# Patient Record
Sex: Male | Born: 1965 | Race: White | Hispanic: No | Marital: Married | Smoking: Former smoker
Health system: Southern US, Community
[De-identification: ages and names within clinical notes are randomized; demographics above are authoritative.]

## PROBLEM LIST (undated history)

## (undated) DIAGNOSIS — M549 Dorsalgia, unspecified: Secondary | ICD-10-CM

## (undated) DIAGNOSIS — M1990 Unspecified osteoarthritis, unspecified site: Secondary | ICD-10-CM

## (undated) DIAGNOSIS — N400 Enlarged prostate without lower urinary tract symptoms: Secondary | ICD-10-CM

## (undated) DIAGNOSIS — F419 Anxiety disorder, unspecified: Secondary | ICD-10-CM

## (undated) DIAGNOSIS — G5600 Carpal tunnel syndrome, unspecified upper limb: Secondary | ICD-10-CM

## (undated) DIAGNOSIS — G8929 Other chronic pain: Secondary | ICD-10-CM

## (undated) DIAGNOSIS — I10 Essential (primary) hypertension: Secondary | ICD-10-CM

---

## 2000-05-16 HISTORY — PX: LUMBAR FUSION: SHX111

## 2000-05-16 HISTORY — PX: BACK SURGERY: SHX140

## 2001-03-27 ENCOUNTER — Encounter: Admission: RE | Admit: 2001-03-27 | Discharge: 2001-03-27 | Payer: Self-pay | Admitting: Orthopaedic Surgery

## 2001-03-27 ENCOUNTER — Encounter: Payer: Self-pay | Admitting: Orthopaedic Surgery

## 2001-04-26 ENCOUNTER — Inpatient Hospital Stay (HOSPITAL_COMMUNITY): Admission: RE | Admit: 2001-04-26 | Discharge: 2001-04-29 | Payer: Self-pay | Admitting: Orthopaedic Surgery

## 2001-04-26 ENCOUNTER — Encounter: Payer: Self-pay | Admitting: Orthopaedic Surgery

## 2002-04-01 ENCOUNTER — Inpatient Hospital Stay (HOSPITAL_COMMUNITY): Admission: EM | Admit: 2002-04-01 | Discharge: 2002-04-02 | Payer: Self-pay | Admitting: Emergency Medicine

## 2002-04-01 ENCOUNTER — Encounter: Payer: Self-pay | Admitting: Emergency Medicine

## 2002-04-02 ENCOUNTER — Encounter: Payer: Self-pay | Admitting: Cardiology

## 2003-10-10 ENCOUNTER — Encounter: Admission: RE | Admit: 2003-10-10 | Discharge: 2003-10-10 | Payer: Self-pay | Admitting: Orthopaedic Surgery

## 2005-04-13 ENCOUNTER — Encounter: Admission: RE | Admit: 2005-04-13 | Discharge: 2005-04-13 | Payer: Self-pay | Admitting: Orthopaedic Surgery

## 2006-05-16 HISTORY — PX: SPINAL CORD STIMULATOR INSERTION: SHX5378

## 2006-09-05 ENCOUNTER — Ambulatory Visit (HOSPITAL_BASED_OUTPATIENT_CLINIC_OR_DEPARTMENT_OTHER): Admission: RE | Admit: 2006-09-05 | Discharge: 2006-09-05 | Payer: Self-pay | Admitting: Orthopedic Surgery

## 2008-02-08 ENCOUNTER — Ambulatory Visit (HOSPITAL_COMMUNITY): Admission: RE | Admit: 2008-02-08 | Discharge: 2008-02-09 | Payer: Self-pay | Admitting: Neurosurgery

## 2009-07-07 ENCOUNTER — Encounter: Payer: Self-pay | Admitting: Pulmonary Disease

## 2009-07-10 ENCOUNTER — Encounter: Payer: Self-pay | Admitting: Pulmonary Disease

## 2009-07-23 ENCOUNTER — Encounter: Payer: Self-pay | Admitting: Pulmonary Disease

## 2009-08-05 ENCOUNTER — Ambulatory Visit: Payer: Self-pay | Admitting: Pulmonary Disease

## 2009-08-05 DIAGNOSIS — R062 Wheezing: Secondary | ICD-10-CM

## 2009-08-05 DIAGNOSIS — R091 Pleurisy: Secondary | ICD-10-CM | POA: Insufficient documentation

## 2009-08-26 ENCOUNTER — Ambulatory Visit: Payer: Self-pay | Admitting: Pulmonary Disease

## 2010-04-05 ENCOUNTER — Ambulatory Visit (HOSPITAL_COMMUNITY)
Admission: RE | Admit: 2010-04-05 | Discharge: 2010-04-05 | Payer: Self-pay | Source: Home / Self Care | Admitting: Neurosurgery

## 2010-04-26 ENCOUNTER — Ambulatory Visit (HOSPITAL_COMMUNITY)
Admission: RE | Admit: 2010-04-26 | Discharge: 2010-04-26 | Payer: Self-pay | Source: Home / Self Care | Attending: Orthopedic Surgery | Admitting: Orthopedic Surgery

## 2010-05-16 HISTORY — PX: CARPAL TUNNEL RELEASE: SHX101

## 2010-05-21 ENCOUNTER — Ambulatory Visit (HOSPITAL_COMMUNITY)
Admission: RE | Admit: 2010-05-21 | Discharge: 2010-05-21 | Payer: Self-pay | Source: Home / Self Care | Attending: Orthopedic Surgery | Admitting: Orthopedic Surgery

## 2010-05-21 LAB — POCT HEMOGLOBIN-HEMACUE: Hemoglobin: 15.8 g/dL (ref 13.0–17.0)

## 2010-05-25 ENCOUNTER — Ambulatory Visit
Admission: RE | Admit: 2010-05-25 | Discharge: 2010-05-25 | Payer: Self-pay | Source: Home / Self Care | Attending: Orthopedic Surgery | Admitting: Orthopedic Surgery

## 2010-05-31 LAB — POCT HEMOGLOBIN-HEMACUE: Hemoglobin: 14.8 g/dL (ref 13.0–17.0)

## 2010-06-15 NOTE — Assessment & Plan Note (Signed)
Summary: bronchitis, wheezing , pleuretic chest pain/not responding to...   Copy to:  [facility]  CC:  Pulmonary consult for chest pain and sob and cough that is prod and non-prod. Some yellowish mucus..  History of Present Illness: 45 yo male for evaluation of wheezing.  He presented to [facility] on Feb 22 with fever, chills, chest congestion, headache, cough with yellow sputum, and body ache.  He tested negative for Influenza, but reports that his son tested positive.  He was noted to have expiratory wheeze on exam which improved after nebulizer treatment.  He was given prednisone, Zpak, and hycodan.  He returned to [facility] on March 5 with similar symptoms.  In addition he had worsening right sided pleuritic chest pain.  He was given additional prednisone and started on ventolin.  He feels that ventolin has helped.  He returned on March 10 to [facility].  He was improving, but had continued chest discomfort.  He still had some wheeze.  He was started on ibuprofen, and pulmonary referral was requested.  He did have an episode of asthmatic bronchitis as a child.  He has not had breathing problems since.  He denies any history of asthma, pneumonia, TB, or allergies.  He denies animal exposure, or occupational exposure.  There is no travel history.  Chest xray report from Feb. 25 showed peribronchial thickening.  Chest xray report from March 10 showed no infiltrate or rib fracture.    Preventive Screening-Counseling & Management  Alcohol-Tobacco     Alcohol drinks/day: 0     Smoking Status: quit     Year Quit: 1995     Pack years: 10 uears x1 ppd  Current Medications (verified): 1)  Ibuprofen 800 Mg Tabs (Ibuprofen) .Marland Kitchen.. 1 By Mouth Three Times A Day 2)  Ventolin Hfa 108 (90 Base) Mcg/act Aers (Albuterol Sulfate) .... 2 Puffs Three Times A Day 3)  Hydrocodone-Acetaminophen 5-500 Mg Tabs (Hydrocodone-Acetaminophen) .Marland Kitchen.. 1-2 By Mouth Every 4-6 Hours As Needed 4)  Alprazolam 1 Mg Tabs  (Alprazolam) .Marland Kitchen.. 1 By Mouth Two Times A Day As Needed  Allergies (verified): 1)  ! Pcn  Past History:  Past Surgical History: 1) Left varicocele 2)  L4-5 right hemilaminectomy with diskectomy and lateral recess decompression, posterior lumbar interbody fusion (PLIF) procedure with autologous left iliac crest bone graft and insertion of a Brantigan cage 3) Left index finger I&D 4) Thoracic laminectomy at T10-T11 with placement of permanent spinal cord stimulator   Family History: Bone cancer---maternal uncle Seasonal allergies---sister  Social History: Married, 3 children.  Works for [employer].  Former smoker 10 pack yr history.  Chews tobacco Smoking Status:  quit Pack years:  10 uears x1 ppd Alcohol drinks/day:  0  Review of Systems       The patient complains of shortness of breath with activity, productive cough, non-productive cough, chest pain, and anxiety.  The patient denies shortness of breath at rest, coughing up blood, irregular heartbeats, acid heartburn, indigestion, loss of appetite, weight change, abdominal pain, difficulty swallowing, sore throat, tooth/dental problems, headaches, nasal congestion/difficulty breathing through nose, sneezing, itching, ear ache, depression, hand/feet swelling, joint stiffness or pain, rash, change in color of mucus, and fever.    Vital Signs:  Patient profile:   45 year old male Height:      72 inches (182.88 cm) Weight:      183.50 pounds (83.41 kg) BMI:     24.98 O2 Sat:      96 % on Room air Temp:  98.7 degrees F (37.06 degrees C) oral Pulse rate:   83 / minute BP sitting:   120 / 74  (right arm) Cuff size:   regular  Vitals Entered By: Michel Bickers CMA (August 05, 2009 3:41 PM)  O2 Sat at Rest %:  96 O2 Flow:  Room air CC: Pulmonary consult for chest pain and sob and cough that is prod and non-prod. Some yellowish mucus.   Physical Exam  General:  normal appearance and healthy appearing.   Eyes:  PERRLA and EOMI.   Ears:   TMs intact and clear with normal canals Nose:  clear nasal discharge.   Mouth:  no deformity or lesions Neck:  no masses, thyromegaly, or abnormal cervical nodes Chest Wall:  tender on palpation over right rib margin Lungs:  clear bilaterally to auscultation and percussion Heart:  regular rate and rhythm, S1, S2 without murmurs, rubs, gallops, or clicks Abdomen:  bowel sounds positive; abdomen soft and non-tender without masses, or organomegaly Msk:  no deformity or scoliosis noted with normal posture Pulses:  pulses normal Extremities:  no clubbing, cyanosis, edema, or deformity noted Neurologic:  CN II-XII grossly intact with normal reflexes, coordination, muscle strength and tone Cervical Nodes:  no significant adenopathy   Pulmonary Function Test Date: 08/05/2009 4:25 PM Gender: Male  Pre-Spirometry FVC    Value: 4.94 L/min   % Pred: 88.90 % FEV1    Value: 4.04 L     Pred: 4.40 L     % Pred: 91.70 % FEV1/FVC  Value: 81.72 %     % Pred: 102.70 %  Impression & Recommendations:  Problem # 1:  WHEEZING (ICD-786.07) He likely had a viral respiratory infection which has triggered asthmatic bronchiitis.  He is slowly improving, but not fully recovered.  Will continue as needed ventolin.  Will give additional course of prednisone.  Will determine if he needs more long term therapy for asthma, or if his symptoms will gradually resolve.  Problem # 2:  PLEURISY (ICD-511.0) He has pleuritic type chest pain in the setting of acute respiratory infection.  His chest xray from Feb 25 and March 10 were relatively unremarkable.  I did not appreciate clinical evidence for pleural effusion on exam today.  I don't think he needs additional imaging studies at this time.  Will have him hold off on scheduled ibuprofen while he is getting additional course of prednisone.  Medications Added to Medication List This Visit: 1)  Ventolin Hfa 108 (90 Base) Mcg/act Aers (Albuterol sulfate) .... 2 puffs three  times a day 2)  Ibuprofen 800 Mg Tabs (Ibuprofen) .Marland Kitchen.. 1 by mouth three times a day 3)  Hydrocodone-acetaminophen 5-500 Mg Tabs (Hydrocodone-acetaminophen) .Marland Kitchen.. 1-2 by mouth every 4-6 hours as needed 4)  Alprazolam 1 Mg Tabs (Alprazolam) .Marland Kitchen.. 1 by mouth two times a day as needed 5)  Prednisone 10 Mg Tabs (Prednisone) .... 3 pills once daily for 2 days, 2 pills once daily for 2 days, 1 pill once daily for 2 days, 1/2 pill once daily for 2 days  Complete Medication List: 1)  Ventolin Hfa 108 (90 Base) Mcg/act Aers (Albuterol sulfate) .... 2 puffs three times a day 2)  Ibuprofen 800 Mg Tabs (Ibuprofen) .Marland Kitchen.. 1 by mouth three times a day 3)  Hydrocodone-acetaminophen 5-500 Mg Tabs (Hydrocodone-acetaminophen) .Marland Kitchen.. 1-2 by mouth every 4-6 hours as needed 4)  Alprazolam 1 Mg Tabs (Alprazolam) .Marland Kitchen.. 1 by mouth two times a day as needed 5)  Prednisone 10 Mg Tabs (  Prednisone) .... 3 pills once daily for 2 days, 2 pills once daily for 2 days, 1 pill once daily for 2 days, 1/2 pill once daily for 2 days  Other Orders: Consultation Level IV (21308) Spirometry w/Graph (94010)  Patient Instructions: 1)  Prednisone 10 mg pills: 3 pills once daily for 2 days, 2 pills once daily for 2 days, 1 pill once daily for 2 days, 1/2 pill once daily for 2 days 2)  Stop ibuprofen while using prednisone 3)  Ventolin two puffs up to four times per day as needed for cough, wheeze, chest congestion 4)  Use nasal irrigation for sinuses once daily  5)  Follow up in 2 weeks Prescriptions: PREDNISONE 10 MG TABS (PREDNISONE) 3 pills once daily for 2 days, 2 pills once daily for 2 days, 1 pill once daily for 2 days, 1/2 pill once daily for 2 days  #13 x 0   Entered and Authorized by:   Coralyn Helling MD   Signed by:   Coralyn Helling MD on 08/05/2009   Method used:   Print then Give to Patient   RxID:   [id number]    CardioPerfect Spirometry  ID: [id number] Patient: Spencer Castro D DOB: 01-29-1966 Age: 45 Years Old Sex:  Male Race: White Physician: Dayle Sherpa Height: 72 Weight: 183.50 Status: Confirmed Recorded: 08/05/2009 4:25 PM  Parameter  Measured Predicted %Predicted FVC     4.94        5.56        88.90 FEV1     4.04        4.40        91.70 FEV1%   81.72        79.60        102.70 PEF    6.31        10.53        59.90   Interpretation: Pre: FVC= 4.94L FEV1= 4.04L FEV1%= 81.7% 4.04/4.94 FEV1/FVC (08/05/2009 4:29:55 PM), Within normal limits.  Poor effort.

## 2010-06-15 NOTE — Letter (Signed)
Summary: Date Range:07-07-09 to 07-23-09/[facility]  Date Range:07-07-09 to 07-23-09/[facility]   Imported By: Sherian Rein 08/14/2009 10:08:07  _____________________________________________________________________  External Attachment:    Type:   Image     Comment:   External Document

## 2010-06-15 NOTE — Assessment & Plan Note (Signed)
Summary: 2 weeks/ mbw   Copy to:  [facility]  CC:  The patient says he is feeling much better after taking Prednisone taper. No wheezing or cough or sob.Marland Kitchen  History of Present Illness: 45 yo male with wheezing and pleurisy.  He feels much better.  He is off prednisone.  He is not taking ibuprofen or tylenol.  He has not used his inhaler in the past one week.  He denies chest pain, cough, fever, sputum, wheeze, or sinus problems.   Current Medications (verified): 1)  Ventolin Hfa 108 (90 Base) Mcg/act Aers (Albuterol Sulfate) .... 2 Puffs Three Times A Day 2)  Ibuprofen 800 Mg Tabs (Ibuprofen) .Marland Kitchen.. 1 By Mouth Three Times A Day 3)  Hydrocodone-Acetaminophen 5-500 Mg Tabs (Hydrocodone-Acetaminophen) .Marland Kitchen.. 1-2 By Mouth Every 4-6 Hours As Needed 4)  Alprazolam 1 Mg Tabs (Alprazolam) .Marland Kitchen.. 1 By Mouth Two Times A Day As Needed  Allergies (verified): 1)  ! Pcn  Vital Signs:  Patient profile:   45 year old male Height:      72 inches (182.88 cm) Weight:      185.38 pounds (84.26 kg) BMI:     25.23 O2 Sat:      94 % on Room air Temp:     98.0 degrees F (36.67 degrees C) oral Pulse rate:   84 / minute BP sitting:   116 / 78  (left arm) Cuff size:   regular  Vitals Entered By: Michel Bickers CMA (August 26, 2009 1:32 PM)  O2 Sat at Rest %:  94 O2 Flow:  Room air  Physical Exam  General:  normal appearance and healthy appearing.   Nose:  no deformity, discharge, inflammation, or lesions Mouth:  no deformity or lesions Neck:  no masses, thyromegaly, or abnormal cervical nodes Chest Wall:  no deformities noted Lungs:  clear bilaterally to auscultation and percussion Heart:  regular rate and rhythm, S1, S2 without murmurs, rubs, gallops, or clicks Extremities:  no clubbing, cyanosis, edema, or deformity noted Cervical Nodes:  no significant adenopathy   Impression & Recommendations:  Problem # 1:  WHEEZING (ICD-786.07)  This has resolved.  Likely related to asthmatic bronchitis from  upper respiratory infection.  He can continue using his inhaler as needed.  Orders: Est. Patient Level II (16109)  Problem # 2:  PLEURISY (ICD-511.0)  Resolved after additional treatment with prednisone.  No further intervention needed at this time.  Orders: Est. Patient Level II (60454)  Complete Medication List: 1)  Ventolin Hfa 108 (90 Base) Mcg/act Aers (Albuterol sulfate) .... 2 puffs three times a day 2)  Ibuprofen 800 Mg Tabs (Ibuprofen) .Marland Kitchen.. 1 by mouth three times a day 3)  Hydrocodone-acetaminophen 5-500 Mg Tabs (Hydrocodone-acetaminophen) .Marland Kitchen.. 1-2 by mouth every 4-6 hours as needed 4)  Alprazolam 1 Mg Tabs (Alprazolam) .Marland Kitchen.. 1 by mouth two times a day as needed  Patient Instructions: 1)  Follow up as needed

## 2010-08-24 ENCOUNTER — Other Ambulatory Visit: Payer: Self-pay | Admitting: Neurosurgery

## 2010-08-24 DIAGNOSIS — M542 Cervicalgia: Secondary | ICD-10-CM

## 2010-08-25 ENCOUNTER — Ambulatory Visit
Admission: RE | Admit: 2010-08-25 | Discharge: 2010-08-25 | Disposition: A | Discharge status: Home / Self Care | Payer: Self-pay | Source: Ambulatory Visit | Attending: Neurosurgery | Admitting: Neurosurgery

## 2010-08-25 ENCOUNTER — Other Ambulatory Visit: Payer: Self-pay | Admitting: Neurosurgery

## 2010-08-25 DIAGNOSIS — M542 Cervicalgia: Secondary | ICD-10-CM

## 2010-09-08 ENCOUNTER — Other Ambulatory Visit: Payer: Self-pay | Admitting: Neurosurgery

## 2010-09-08 DIAGNOSIS — M542 Cervicalgia: Secondary | ICD-10-CM

## 2010-09-17 ENCOUNTER — Ambulatory Visit
Admission: RE | Admit: 2010-09-17 | Discharge: 2010-09-17 | Disposition: A | Discharge status: Home / Self Care | Payer: BC Managed Care – PPO | Source: Ambulatory Visit | Attending: Neurosurgery | Admitting: Neurosurgery

## 2010-09-17 ENCOUNTER — Other Ambulatory Visit: Payer: Self-pay | Admitting: Neurosurgery

## 2010-09-17 DIAGNOSIS — M542 Cervicalgia: Secondary | ICD-10-CM

## 2010-09-20 ENCOUNTER — Ambulatory Visit
Admission: RE | Admit: 2010-09-20 | Discharge: 2010-09-20 | Disposition: A | Discharge status: Home / Self Care | Payer: BC Managed Care – PPO | Source: Ambulatory Visit | Attending: Neurosurgery | Admitting: Neurosurgery

## 2010-09-20 ENCOUNTER — Other Ambulatory Visit: Payer: Self-pay | Admitting: Neurosurgery

## 2010-09-20 DIAGNOSIS — R51 Headache: Secondary | ICD-10-CM

## 2010-09-20 DIAGNOSIS — M542 Cervicalgia: Secondary | ICD-10-CM

## 2010-09-20 DIAGNOSIS — M549 Dorsalgia, unspecified: Secondary | ICD-10-CM

## 2010-09-28 NOTE — Op Note (Signed)
NAMEDARRYL, Spencer Castro                 ACCOUNT NO.:  [id number]   MEDICAL RECORD NO.:  [id number]          PATIENT TYPE:  OIB   LOCATION:  3538                         FACILITY:  [facility]   PHYSICIAN:  Danae Orleans. Venetia Maxon, M.D.  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   PROCEDURES:  Thoracic laminectomy at T10-T11 with placement of permanent  spinal cord stimulator lead and implantable pulse generator using [employer].   SURGEON:  Danae Orleans. Venetia Maxon, MD   ASSISTANT:  Georgiann Cocker, RN   ANESTHESIA:  General endotracheal anesthesia.   ESTIMATED BLOOD LOSS:  Minimal.   COMPLICATIONS:  None.   DISPOSITION:  Recovery.   INDICATIONS:  [name] is a 45 year old man with chronic intractable  back and right greater than left lower extremity pain.  He has had prior  spinal surgery.  It was elected to take him to Surgery for spinal cord  stimulator placement after successful trial of spinal cord stimulation.   PROCEDURE IN DETAIL:  [name] was brought to the operating room.  Following satisfactory and uncomplicated induction of general  endotracheal anesthesia and placement of intravenous lines, he was  placed in a prone position on chest rolls.  His mid back was prepped and  draped and right flank were prepped and draped in the usual sterile  fashion after localizing x-ray confirmed correct level at T10-T11.  Subsequently, the areas of planned incision were infiltrated with local  lidocaine.  An incision was made in the midline and carried to the  thoracic fascia which was incised on the right side of midline exposing  the T10-T11 level.  This was again confirmed radiographically and a  hemilaminotomy of T10 was performed exposing the epidural space and  after using the trial dissector, the [employer] implantable  spinal cord stimulator paddle lead was then placed.  It was positioned  so that the superior aspect of T9 slightly to right of midline and  extended over the T9 and T10 levels.  Hemostasis was assured, the fascia  was closed, and anchors were placed around the leads and anchored with 2-  0 silk stitches.  A subcutaneous pocket was created in the right flank  area and the battery implantable pulse generator was then hooked to the  leads, which were anchored using a standard locking mechanisms and using  torque screwdriver and subsequently this was inserted into the  subcutaneous pocket.  The wounds were irrigated and closed with 2-0 and  3-0 Vicryl sutures and a sterile occlusive dressing was placed.  The  final x-ray demonstrated the well-positioned lead  had not moved during assembly of the implantable pulse generator.  The  impedances were  checked and found to be acceptable.  The patient was  then x-rayed in the operating room and taken to the recovery in stable  and satisfactory condition having tolerated his operation well.  All  counts were correct at the end of the case.      Danae Orleans. Venetia Maxon, M.D.  Electronically Signed     JDS/MEDQ  D:  02/08/2008  T:  02/09/2008  Job:  [id number]

## 2010-10-01 NOTE — H&P (Signed)
[facility]  Patient:    Spencer Castro, Spencer Castro Visit Number: [id number] MRN: [id number]          Service Type: Attending:  Patricia Nettle, M.D. Dictated by:   Druscilla Brownie. Shela Nevin, P.A. Adm. Date:  04/27/01                           History and Physical  DATE OF BIRTH:  31-Mar-1966. DATE OF HISTORY AND PHYSICAL:  April 19, 2001.  CHIEF COMPLAINT:  "Pain in my back and right leg.  HISTORY OF PRESENT ILLNESS:  This 45 year old white male was seen by [place] for continued progressive problems concerning pain in his low back with radiation to the right lower extremity.  He has a positive straight leg raise on the right with pain radiating to the lower extremity in the L5 distribution. Sensation is decreased in the right L5 distribution.  Reflexes are equal both in the knees and ankle.  EMG has shown early right L4-5 radiculopathy.  A CT myelogram showed left foraminal disk protrusion L3-4 with _____ of the left L3 dorsal root ganglia.  Also, pathology is seen at L4-5 with right foraminal narrowing secondary to spurring and disk protrusion.  His major pathology is consistent with his discopathy and radiculopathy at the L4-5.  Decided that due to the fact that this patient has not benefited with conservative care, we felt he would benefit from surgical intervention and being admitted for an L4-5 laminectomy with L4-5 fusion and pedicle screws and posterior lateral interbody fusion with posterior iliac crest bone graft.  PAST MEDICAL HISTORY:  This gentleman has been in excellent health throughout his lifetime.  He does have a prostatitis which currently is being treated. He had a varicocele excision in 1989 or 1990 on the left.  Fracture of the right arm in 1992.  MEDICATIONS:  Percocet one to two p.o. q.4-6h. p.r.n. pain.  ALLERGIES:  PENICILLIN.  FAMILY PHYSICIAN:  Dr. Henreitta Leber.  SOCIAL HISTORY:  The patient neither smokes nor drinks.  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  He lives in a two-story house with 12 stairs.  REVIEW OF SYSTEMS:  CENTRAL NERVOUS SYSTEM:  No seizure disorder, paralysis, double vision, but the patient does have radiculopathy in the right lower extremity consistent with L4-5 nerve root irritation.  CARDIOVASCULAR:  No chest pain, no angina, no orthopnea.  RESPIRATORY:  No productive cough, no hemoptysis, no shortness of breath.  GASTROINTESTINAL:  No nausea, vomiting, melena, or bloody stools.  GENITOURINARY:  No discharge, dysuria, or hematuria.  MUSCULOSKELETAL:  Primary in present illness.  PHYSICAL EXAMINATION:  VITAL SIGNS:  Blood pressure 130/88, pulse 90, respirations 12.  GENERAL:  Alert and cooperative, friendly, somewhat uncomfortable 45 year old white male.  HEENT:  Normocephalic.  PERRLA, EOMs intact.  Oropharynx is clear.  CHEST:  Clear to auscultation.  No rhonchi, no rales.  CARDIAC:  Regular rate and rhythm.  No murmurs are heard.  ABDOMEN:  Soft, nontender.  Liver, spleen not felt.  GENITALIA/RECTAL:  Not done, not pertinent to present illness.  EXTREMITIES:  As in present illness above.  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  PLAN:  The patient will undergo L4-5 laminectomy with L4-5 fusion with pedicle screws, posterolateral interbody fusion, and posterior iliac crest bone graft. ictated by:   Druscilla Brownie. Shela Nevin, P.A. Attending:  Carmel [place].  Noel Gerold, M.D. DD:  04/23/01 TD:  04/23/01 Job: [id number]

## 2010-10-01 NOTE — Op Note (Signed)
Spencer Castro, Spencer Castro                 ACCOUNT NO.:  [id number]   MEDICAL RECORD NO.:  [id number]          PATIENT TYPE:  AMB   LOCATION:  DSC                          FACILITY:  [facility]   PHYSICIAN:  Katy Fitch. Sypher, M.D. DATE OF BIRTH:  07-28-1965   DATE OF PROCEDURE:  09/05/2006  DATE OF DISCHARGE:                               OPERATIVE REPORT   PREOPERATIVE DIAGNOSES:  1. Status post laceration, dorsal aspect of the left index and long      fingers, due to crushing injury sustained on loading dock at [employer] on September 01, 2006.  2. Also status post exploration with wound irrigation and closure at      the [facility] with identification of an extensor      tendon injury to the left index finger that involved the proximal      interphalangeal and periosteum of the middle phalanx.   POSTOPERATIVE DIAGNOSES:  1. Retained foreign material at level of periosteum in middle phalanx      of left index dorsal proximal interphalangeal joint.  2. Laceration of triangular ligament and central slip and partial      laceration of radial lateral band of left index finger extensor      mechanism.   OPERATION:  1. Re-exploration of left index finger wound, identifying retained      foreign material at level of periosteum.  2. Excisional debridement of wound including soiled periosteum,      cortical bone of middle phalanx, subcutaneous tissue and skin.  3. Repair of triangular ligament, central slip and radial lateral      band, left index finger extensor mechanism.   OPERATING SURGEON:  Katy Fitch. Sypher, MD   ASSISTANT:  Annye Rusk PA-C   ANESTHESIA:  2% lidocaine metacarpal head-level block of left index  finger.  This was performed the minor operating room at [facility] day surgery  center.   INDICATIONS:  Spencer Castro is a 45 year old right-hand-dominant  employee of [employer] referred to Spencer Castro at the  [facility] following an on-the-job injury on September 01, 2006.   Spencer Castro was handling materials on a loading dock at [employer] when he lost  control of a package that weighed considerably more than anticipated.   His left hand was trapped against the  loading dock, sustaining deep  lacerations to the dorsal aspect of his left index and long fingers.  The index finger laceration was just distal to the extensor creases of  the PIP joint and the long finger laceration over the diaphysis of the  proximal phalanx.   Dr. Katrinka Blazing placed digital block anesthesia at the [facility] and explored the wounds.   She noted only a shallow laceration into the extensor mechanism of the  long finger that should heal by secondary intention.  She noted a  laceration through the extensor mechanism into the PIP joint capsule and  was concerned that the PIP joint was exposed in the index finger.  Dr. Katrinka Blazing requested an urgent hand surgery consult.   We discussed the wounds in detail on the phone on September 01, 2006, and  given the fact that she had irrigated the wounds, I advised her to close  the skin and have Spencer Castro present for an elective exploration and  repair at this time.   We saw him for consult in the office on September 04, 2006, and his wounds  were noted to be stable.  Therefore, he is scheduled for exploration at  the [facility] minor operating room.   Preoperatively we advised him that we would repair the extensor  mechanism and joint capsule as our exploration findings indicated.  He  also understood that he would need to be on either temporary disability  or one-hand duty for up to 6 weeks depending on the nature of his  extensor tendon injury.  He is brought to the operating room at this  time.   PROCEDURE:  Spencer Castro was brought to the operating room and placed in  supine position on the operating table.   Following informed consent, he underwent digital block of the left index   finger utilizing 2% lidocaine at metacarpal head level.   After 5 minutes, he had excellent anesthesia.   His arm was then prepped with Betadine soap and solution and sterilely  draped.  His nylon sutures from his original wound repair were removed  without difficulty.  The wound was explored and extended proximally with  a 15 scalpel blade.  The triangular ligament, central slip and radial  lateral band were lacerated.  The radial lateral band laceration was 50%  of its width.  The capsule of the PIP joint was not directly violated;  however, the periosteum overlying the insertion of the extensor tendon  at the base of proximal phalanx was lacerated down into the cortical  bone.  There was some metallic and gritty foreign material within the  wound deep to the periosteum and deep to the extensor.   This was meticulously debrided with a fine forceps, followed by  curettage of the bone and saline sponge irrigation with scrubbing.   The periosteum was then replaced in anatomic position and the extensor  mechanism including the triangular ligament, central slip and radial  lateral band were repaired with a series of four figure-of-eight sutures  of 3-0 Ethibond with knots buried deep to the extensor mechanism.   An anatomic repair was achieved while maintaining the PIP joint and DIP  joint in full extension.   The skin was repaired with mattress suture of 5-0 nylon.  The finger was  dressed with Xeroflo sterile gauze, followed by a Coban dressing with a  dorsal Alumafoam splint maintaining the PIP and DIP joints in full  extension.   We advised Spencer Castro to elevate his hand for 48 hours.  He will finish  his prescription of doxycycline 100 mg p.o. b.i.d. provided by the  [facility].   He had 6 days of medication remaining.   He also has an oral analgesic in the form of a hydrocodone and Tylenol and due to a prior back injury has other opioid medication at home  that  he uses for a chronic pain predicament involving his lower extremities.   There were no apparent complications.  Spencer Castro was advised remain on  temporary disability until his next office visit on September 13, 2006, or  should he return sooner p.r.n. dressing problems or other issues.  He will report any bleeding, increased pain or signs of infection.      Katy Fitch Sypher, M.D.  Electronically Signed     RVS/MEDQ  D:  09/05/2006  T:  09/05/2006  Job:  [id number]   cc:   Spencer Castro, M.D.

## 2010-10-01 NOTE — Op Note (Signed)
[place]. [facility]  Patient:    Spencer Castro, Spencer Castro Visit Number: [id number] MRN: [id number]          Service Type: SUR Location: [id number] Attending Physician:  Stevie Kern Dictated by:   Patricia Nettle, M.D. Proc. Date: 04/26/01 Admit Date:  04/26/2001                             Operative Report  DATE OF BIRTH:  Aug 04, 1965.  PREOPERATIVE DIAGNOSES: 1. Degenerative disk disease, L4-5. 2. 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 3. History of nicotine dependence.  PROCEDURES: 1. L4-5 right hemilaminectomy with diskectomy and lateral recess    decompression. 2. Posterior lumbar interbody fusion (PLIF) procedure with autologous    left iliac crest bone graft and insertion of a Brantigan cage, 9 x 9    x 25 mm. 3. Instrumented posterolateral fusion with Monarch pedicle screw system. 4. Local bone graft. 5. Allograft using DBX mix. 6. Intrathecal catheterization and injection of 1 cc long-acting    preservative-free morphine.  SURGEON:  Patricia Nettle, M.D.  ASSISTANT:  Dorie Rank, P.A.  COMPLICATIONS:  None.  INDICATION:  The patient is a 45 year old male who was injured while at work for [employer] when he was attacked by two dogs.  He suffered a right hand injury as well as an injury to his back.  His MRI and CT myelogram demonstrated a disk herniation at L4-5 with lateral recess and foraminal narrowing, right greater than left.  He had an EMG which showed an L4 and L5 right radiculopathy.  On physical examination he has weakness in the EHL and tibialis anterior on the right side.  He has a positive straight leg raise.  He has been to over 20 sessions of physical therapy, and his symptoms are worsening.  He is currently taking narcotic pain medication.  We discussed both diskectomy alone as well as diskectomy with L4-5 fusion.  I think the patient has a good understanding of the issues involved.  In addition, he has a foraminal  herniation on the left at L3-4, which is asymptomatic.  He understands that there is a significant risk that he will need additional surgery to address the adjacent segment in the future.  DESCRIPTION OF PROCEDURE:  The patient was properly identified in the holding area and taken to the operating room.  He underwent general endotracheal anesthesia without difficulty.  He was turned prone onto the Acromed four-poster frame.  All bony prominences were padded.  He was given prophylactic IV antibiotics.  The back was prepped and draped in the usual fashion.  A 10 cm incision was made L3 to the sacrum.  Dissection was carried down to the deep fascia.  The deep fascia was incised and the paraspinous muscles were stripped out to the tips of the transverse processes of the L4 and L5 bilaterally.  Care was taken to protect the L3-4 as well as the L5-S1 joint.  Intraoperative localizing x-ray was taken.  After satisfactory exposure, a Leksell rongeur was used to remove the inferior half of the L4 spinous process as well as the superior half of the L5 spinous process.  A hemilaminotomy was performed and carried out to the pedicle on the right side. The medial portion of the inferior facet of 4 was taken.  The lateral recess was decompressed.  The L5 root was identified, but care was taken  not to expose it, keeping its epidural fat around it.  The dural sac was gently retracted, and a large protuberant disk herniation was identified.  An annulotomy was made and the disk was removed.  There was a spur off the L5 vertebral body protruding into the canal, and this was debrided where it was impinging upon the L5 root.  At this point a laminar spreader was placed between the remaining 4 and 5 spinous processes.  Distraction was applied. Using specialized angled curettes, a complete diskectomy was performed and the end plates were removed.  Care was taken not to destroy the subchondral bone. After  irrigating the disk space, iliac crest bone graft was harvested from the left hip using a 3 cm incision over the posterior iliac crest.  Dissection was carried down to the deep fascia.  The cap of the crest was removed using a Leksell rongeur, and then curettes were utilized to remove bone from within the table.  The wound was closed using #1 Vicryl and 2-0 Vicryl followed by staples after irrigation.  At this point attention was directed back to the midline incision.  The cancellous autograft was then firmly packed into the disk space while distraction was being applied.  At all times care was taken to protect the exiting L5 root.  At this point a 9 x 9 x 25 Brantigan cage was packed with autogenous bone and impacted into the disk space and tamped over toward the midline.  Additional bone was then placed laterally to the cage. Care was taken to be sure that it was seated below the vertebral margins.  At this point pedicle screws were placed using an anatomic probing technique, first the awl, followed by the second pedicle probe, and then a ball-tipped feeler.  Tapped and then the feeler was utilized again to confirm that there was no breach.  Screws 6.25 x 40 were placed at the L4 and L5 pedicles bilaterally using this technique.  At this point intraoperative x-ray was taken and showed excellent position of the pedicle screws as well as the intervertebral cage.  The transverse processes were decorticated.  The facet joints were decorticated at the L4-5 segment bilaterally.  The lamina and pars was decorticated on the left side.  The remaining autogenous bone both from the iliac crest as well as local bone that was collected from the laminectomy were then packed into the lateral gutters.  Fifteen cubic centimeters of DBX mix was then packed on top of this.  The rods were placed, and the Monarch caps were tightened.  Gentle compression was applied bilaterally.  A deep drain was placed.  The  wound was copiously irrigated.  The fascia was closed using a running #1 Vicryl suture.  The subcutaneous layer was closed using 2-0  Vicryl interrupted sutures, and the staples were used for the skin.  A sterile dressing was applied.  The patient was transferred to the recovery room in stable condition wiggling his toes.  It should be noted that two hours before the conclusion of the procedure, a 25-gauge spinal needle was used to cannulate the intrathecal space and 1 cc of long-acting preservative-free morphine was injected for pain control. Dictated by:   Patricia Nettle, M.D. Attending Physician:  Stevie Kern DD:  04/26/01 TD:  04/27/01 Job: [id number]

## 2010-10-01 NOTE — H&P (Signed)
NAME:  Spencer Castro, Spencer Castro NO.:  [id number]   MEDICAL RECORD NO.:  [id number]                   PATIENT TYPE:  EMS   LOCATION:  MAJO                                 FACILITY:  [facility]   PHYSICIAN:  W. Ashley Royalty., M.D.         DATE OF BIRTH:  1965-05-30   DATE OF ADMISSION:  04/01/2002  DATE OF DISCHARGE:                                HISTORY & PHYSICAL   This very nice, 45 year old male is seen at the request of the emergency  room physician for evaluation of substernal pressure.  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  He had injuries to his hand and to his back,  and one year ago underwent spinal fusion by Dr. Noel Gerold.  He has made a  reasonable recovery from that, but has chronic mid-back burning-type pain  and had an MRI on Friday of his thoracic vertebra region.  He has had  increased situational stress and anxiety recently and has had some sleep  disturbance.  He was in his usual state of health until he awoke this  morning around 6 a.m. with midsternal pressure that persisted beside the  shower and with his getting his children to go to school, and he was brought  to the emergency room.  An EKG was unremarkable.  Initial enzymes were  negative.  The pain abated slowly spontaneously and also with the aid of a  nitroglycerin and lasted around two hours.  He had some mild tingling of his  face.  The pain is not worsened with food or with exertion.  It did not  appear to be indigestion.  He is admitted at this time to rule out a  myocardial infarction.  He has no previous pain like it.   PAST HISTORY:  Negative for hypertension and diabetes.  He may have had a  high cholesterol in the past.  Previous surgery:  Varicocele surgery and  also had lumbar fusion.   ALLERGIES:  Penicillin and  morphine.   CURRENT MEDICATIONS:  Methocarbamol previously.   FAMILY HISTORY:  Father is 12 and is alive, is a smoker, and has high  cholesterol.  Mother is 71 and healthy.  He has a sister who is in good  health.  There is of family history of premature cardiac disease.   SOCIAL HISTORY:  He has been married for 18 years.  He and his wife have  three children, ages 49, 66, and 74.  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  He is a nonsmoker but dips snuff, drinks alcohol occasionally socially, no  regular church attendance.   REVIEW OF SYSTEMS:  Mid-back pain as noted above, no known history of  angina, no PND, orthopnea, or edema, no  syncope.  Other than as noted above,  the remainder of the review of systems is unremarkable.   PHYSICAL EXAMINATION:  GENERAL:  A healthy-appearing, pleasant male who is  in no acute distress.  VITAL SIGNS:  Blood pressure currently 136/77, pulse 76.  SKIN:  Warm and dry.  ENT:  EOMI, PERRLA.  Skin is clear.  Pharynx negative.  NECK:  Supple, no masses, no JVD, thyromegaly, or carotid bruits.  LUNGS:  Clear to A&P.  CARDIAC EXAM:  Normal S1 and S2, no S3, S4, or murmur.  ABDOMEN:  Soft, nontender, no hepatosplenomegaly, no masses, or aneurysm.  Femoral distal pulses are 2+.  There is no edema noted.   DATA:  A 12-lead ECG shows sinus bradycardia with a rate of 46, normal EKG.  Chest x-ray was unremarkable.   LABORATORY DATA:  Troponin of 0.01.  Sodium 139, potassium 3.9, chloride  107, BUN 13, glucose 95.  WBC 4800, hemoglobin 14.8, hematocrit 43.3, CPK  108, MB 1.3.   IMPRESSION:  1. Prolonged chest discomfort which is currently resolved, rule out     myocardial infarction and unstable angina.  2. Situational stress and anxiety.  3. History of low back pain with lumbar fusion.   RECOMMENDATIONS:  Admit at this time to rule out myocardial infarction,  check serial CPK and enzymes, begin Lovenox and aspirin.  Hold beta blocker  because heart rate  is slow.  Possible treadmill test if MI rules out.                                                Darden Palmer., M.D.    WST/MEDQ  D:  04/01/2002  T:  04/01/2002  Job:  [id number]

## 2010-10-01 NOTE — Discharge Summary (Signed)
[place]. [facility]  Patient:    Spencer Castro, Spencer Castro Visit Number: [id number] MRN: [id number]          Service Type: SUR Location: [phone] Attending Physician:  Stevie Kern Dictated by:   Druscilla Brownie. Underwood III, P.A.-C. Admit Date:  04/26/2001 Disc. Date: 04/29/01                             Discharge Summary  OPERATION:  On 04/26/01, the patient underwent: 1. Right L4 hemilaminectomy with diskectomy and lateral recess decompression. 2. Posterior lumbar interbody fusion procedure with autologous left iliac    crest bone graft, insertion of Brantigan cage. 3. Instrumented posterolateral fusion with Monarch pedicle screw system. 4. Local bone graft. 5. Allograft using DBX mix. 6. Intrathecal catheterization injection of 1 cc long-acting,    preservative-free morphine, Dorie Rank, P.A.-C. assisted.  CONSULTS:  None.  BRIEF HISTORY:  This 45 year old male was injured when he was working at a [employer].  He had a right hand injury as well as injured his back.  He had a disk herniation at L4-5 with lateral recess and foraminal narrowing.  EMG showed a right L4-L5 radiculopathy.  He also weakness in the EHL and tibialis anterior. It was felt that this patient would benefit from surgical intervention, particularly in light of the fact that he continued with persistent positive straight leg raise on the right, and the only thing that could control his discomfort was narcotic medications.  HOSPITAL COURSE:  The patient tolerated the surgical procedure quite well.  He did have a considerable amount of pain and discomfort which is expected postop, but eventually we were able to wean him to p.o. analgesics.  Hemovac was removed the first postop day.  We were quite concerned that the patient would develop an ileus postop.  Bowel sounds were sluggish but present.  We placed him on a GI-type protocol.  He continued to pass flatus.  Abdomen remained soft.  He did not  have a BM, however.  Physical therapy worked with the patient for doffing and donning his TLSO brace.  He was very comfortable with this.  The day of discharge, we examined the wound.  All the staples were intact.  The wound was dry.  He continued with some minimal weakness in the right EHL, but he had excellent ankle dorsiflexion at 5/5.  He was anxious to go home, as he was quite confident in brace wearing.  He had excellent family support as well.  We did want to be sure that he had a bowel movement prior to discharge. Dulcolax suppository and milk-of-magnesia was given.  If he is able to have his BM, then he is to go home today.  HOSPITAL LABORATORY VALUES:  Hematologically showed a CBC completely within normal limits.  His postop hemoglobin was 12.3, hematocrit 35.5.  Electrolytes were normal as well.  Urinalysis negative.  No chest x-ray seen in the chart and no electrocardiogram.  CONDITION ON DISCHARGE:  Improved, stable.  PLAN:  The patient is discharged home after he has BM today.  He is to use over-the-counter stool softeners and laxative of choice and enema of choice.  DISCHARGE MEDICATIONS: 1. Percocet 5 mg #40 1-2 q.4-6h. p.r.n. pain, no refill. 2. Valium 5 mg #30 with a refill, 1 q.4-6h. p.r.n. muscle spasm.  Prescription and order is also given for a rolling walker that he can use at home to assist  him coming to a standing position from a seated position.  He will return to the clinic in about 10-14 days.  The patient may shower briefly on day four postop.  He is to use the brace at all times when he is up and about.  He may take the brace off when he is supine.  Use dry dressing p.r.n.Dictated by:   Druscilla Brownie. Underwood III, P.A.-C. Attending Physician:  Stevie Kern DD:  04/29/01 TD:  04/29/01 Job: [id number]

## 2010-11-29 ENCOUNTER — Ambulatory Visit
Admission: RE | Admit: 2010-11-29 | Discharge: 2010-11-29 | Disposition: A | Discharge status: Home / Self Care | Payer: BC Managed Care – PPO | Source: Ambulatory Visit | Attending: Endocrinology | Admitting: Endocrinology

## 2010-11-29 ENCOUNTER — Other Ambulatory Visit: Payer: Self-pay | Admitting: Endocrinology

## 2010-11-29 DIAGNOSIS — E236 Other disorders of pituitary gland: Secondary | ICD-10-CM

## 2010-11-29 MED ORDER — IOHEXOL 300 MG/ML  SOLN: 75.0000 mL | Freq: Once | INTRAMUSCULAR | Status: AC | PRN

## 2011-02-09 ENCOUNTER — Other Ambulatory Visit: Payer: Self-pay | Admitting: Anesthesiology

## 2011-02-09 DIAGNOSIS — M549 Dorsalgia, unspecified: Secondary | ICD-10-CM

## 2011-02-10 ENCOUNTER — Ambulatory Visit
Admission: RE | Admit: 2011-02-10 | Discharge: 2011-02-10 | Disposition: A | Discharge status: Home / Self Care | Payer: BC Managed Care – PPO | Source: Ambulatory Visit | Attending: Anesthesiology | Admitting: Anesthesiology

## 2011-02-10 ENCOUNTER — Ambulatory Visit
Admission: RE | Admit: 2011-02-10 | Discharge: 2011-02-10 | Disposition: A | Discharge status: Home / Self Care | Payer: Worker's Compensation | Source: Ambulatory Visit | Attending: Anesthesiology | Admitting: Anesthesiology

## 2011-02-10 DIAGNOSIS — M549 Dorsalgia, unspecified: Secondary | ICD-10-CM

## 2011-02-10 MED ORDER — IOHEXOL 180 MG/ML  SOLN
15.0000 mL | Freq: Once | INTRAMUSCULAR | Status: AC | PRN
  Administered 2011-02-10: 15 mL via INTRAVENOUS

## 2011-02-10 MED ORDER — DIAZEPAM 2 MG PO TABS
10.0000 mg | ORAL_TABLET | Freq: Once | ORAL | Status: AC
  Administered 2011-02-10: 10 mg via ORAL

## 2011-02-10 NOTE — Progress Notes (Signed)
Patient states he does not want any pain medicine at present; doesn't like how it makes him feel.  "It's tolerable right now."  Annie at bedside.  jkl

## 2011-02-10 NOTE — Discharge Instructions (Signed)

## 2011-02-14 LAB — CBC
MCV: 87.4
RBC: 5.02
WBC: 5.6

## 2011-03-24 ENCOUNTER — Encounter (HOSPITAL_COMMUNITY): Payer: Self-pay | Admitting: Pharmacy Technician

## 2011-03-25 ENCOUNTER — Encounter (HOSPITAL_COMMUNITY)
Admission: RE | Admit: 2011-03-25 | Discharge: 2011-03-25 | Disposition: A | Discharge status: Home / Self Care | Payer: Worker's Compensation | Source: Ambulatory Visit | Attending: Neurosurgery | Admitting: Neurosurgery

## 2011-03-25 ENCOUNTER — Encounter (HOSPITAL_COMMUNITY): Payer: Self-pay

## 2011-03-25 HISTORY — DX: Benign prostatic hyperplasia without lower urinary tract symptoms: N40.0

## 2011-03-25 HISTORY — DX: Anxiety disorder, unspecified: F41.9

## 2011-03-25 HISTORY — DX: Carpal tunnel syndrome, unspecified upper limb: G56.00

## 2011-03-25 LAB — CBC
MCH: 29.7 pg (ref 26.0–34.0)
MCV: 85.9 fL (ref 78.0–100.0)
Platelets: 226 10*3/uL (ref 150–400)
RDW: 13.1 % (ref 11.5–15.5)
WBC: 6.3 10*3/uL (ref 4.0–10.5)

## 2011-03-25 NOTE — Pre-Procedure Instructions (Signed)
20 TORELL MINDER  03/25/2011   Your procedure is scheduled on:  Friday, Nov 16  Report to [facility] at 1145 AM.  Call this number if you have problems the morning of surgery: [phone]   Remember:   Do not eat food:After Midnight.  Do not drink clear liquids: 4 Hours before arrival.  Take these medicines the morning of surgery with A SIP OF WATER: Dilaudid,Keppra   Do not wear jewelry, make-up or nail polish.  Do not wear lotions, powders, or perfumes. You may wear deodorant.  Do not shave 48 hours prior to surgery.  Do not bring valuables to the hospital.  Contacts, dentures or bridgework may not be worn into surgery.  Leave suitcase in the car. After surgery it may be brought to your room.  For patients admitted to the hospital, checkout time is 11:00 AM the day of discharge.   Patients discharged the day of surgery will not be allowed to drive home.  Name and phone number of your driver: Spouse or mother  Special Instructions: CHG Shower Use Special Wash: 1/2 bottle night before surgery and 1/2 bottle morning of surgery.   Please read over the following fact sheets that you were given: Pain Booklet, Coughing and Deep Breathing, MRSA Information and Surgical Site Infection Prevention

## 2011-03-31 MED ORDER — CEFAZOLIN SODIUM 1-5 GM-% IV SOLN
1.0000 g | INTRAVENOUS | Status: DC
  Filled 2011-03-31: qty 50

## 2011-04-01 ENCOUNTER — Encounter (HOSPITAL_COMMUNITY): Payer: Self-pay | Admitting: Anesthesiology

## 2011-04-01 ENCOUNTER — Ambulatory Visit (HOSPITAL_COMMUNITY)
Admission: RE | Admit: 2011-04-01 | Discharge: 2011-04-02 | Disposition: A | Discharge status: Home / Self Care | Payer: Worker's Compensation | Source: Ambulatory Visit | Attending: Neurosurgery | Admitting: Neurosurgery

## 2011-04-01 ENCOUNTER — Encounter (HOSPITAL_COMMUNITY)
Admission: RE | Disposition: A | Discharge status: Home / Self Care | Payer: Self-pay | Source: Ambulatory Visit | Attending: Neurosurgery

## 2011-04-01 ENCOUNTER — Ambulatory Visit (HOSPITAL_COMMUNITY): Payer: Worker's Compensation | Admitting: Anesthesiology

## 2011-04-01 ENCOUNTER — Ambulatory Visit (HOSPITAL_COMMUNITY): Payer: Worker's Compensation

## 2011-04-01 DIAGNOSIS — G8929 Other chronic pain: Secondary | ICD-10-CM | POA: Insufficient documentation

## 2011-04-01 DIAGNOSIS — M792 Neuralgia and neuritis, unspecified: Secondary | ICD-10-CM

## 2011-04-01 DIAGNOSIS — M961 Postlaminectomy syndrome, not elsewhere classified: Secondary | ICD-10-CM | POA: Insufficient documentation

## 2011-04-01 HISTORY — PX: SPINAL CORD STIMULATOR INSERTION: SHX5378

## 2011-04-01 SURGERY — INSERTION, SPINAL CORD STIMULATOR, LUMBAR
Anesthesia: General | Laterality: Right

## 2011-04-01 MED ORDER — PROMETHAZINE HCL 25 MG/ML IJ SOLN: 6.2500 mg | INTRAMUSCULAR | Status: DC | PRN

## 2011-04-01 MED ORDER — HEMOSTATIC AGENTS (NO CHARGE) OPTIME
TOPICAL | Status: DC | PRN
  Administered 2011-04-01: 1 via TOPICAL

## 2011-04-01 MED ORDER — SODIUM CHLORIDE 0.9 % IJ SOLN
3.0000 mL | Freq: Two times a day (BID) | INTRAMUSCULAR | Status: DC
  Administered 2011-04-01: 3 mL via INTRAVENOUS

## 2011-04-01 MED ORDER — FENTANYL CITRATE 0.05 MG/ML IJ SOLN
INTRAMUSCULAR | Status: DC | PRN
  Administered 2011-04-01: 50 ug via INTRAVENOUS
  Administered 2011-04-01: 100 ug via INTRAVENOUS
  Administered 2011-04-01 (×3): 50 ug via INTRAVENOUS
  Administered 2011-04-01: 100 ug via INTRAVENOUS

## 2011-04-01 MED ORDER — HYDROMORPHONE HCL PF 1 MG/ML IJ SOLN: 0.2500 mg | INTRAMUSCULAR | Status: DC | PRN

## 2011-04-01 MED ORDER — LACTATED RINGERS IV SOLN
INTRAVENOUS | Status: DC | PRN
  Administered 2011-04-01 (×3): via INTRAVENOUS

## 2011-04-01 MED ORDER — ACETAMINOPHEN 650 MG RE SUPP: 650.0000 mg | RECTAL | Status: DC | PRN

## 2011-04-01 MED ORDER — ONDANSETRON HCL 4 MG/2ML IJ SOLN: 4.0000 mg | INTRAMUSCULAR | Status: DC | PRN

## 2011-04-01 MED ORDER — TESTOSTERONE 50 MG/5GM (1%) TD GEL: 5.0000 g | Freq: Every day | TRANSDERMAL | Status: DC

## 2011-04-01 MED ORDER — DIAZEPAM 5 MG PO TABS
10.0000 mg | ORAL_TABLET | Freq: Three times a day (TID) | ORAL | Status: DC | PRN
  Administered 2011-04-01: 10 mg via ORAL
  Filled 2011-04-01: qty 2

## 2011-04-01 MED ORDER — LEVETIRACETAM 250 MG PO TABS
250.0000 mg | ORAL_TABLET | Freq: Two times a day (BID) | ORAL | Status: DC
  Administered 2011-04-02: 250 mg via ORAL
  Filled 2011-04-01 (×3): qty 1

## 2011-04-01 MED ORDER — MORPHINE SULFATE 30 MG PO TABS
30.0000 mg | ORAL_TABLET | Freq: Once | ORAL | Status: AC
  Administered 2011-04-01: 30 mg via ORAL
  Filled 2011-04-01: qty 1

## 2011-04-01 MED ORDER — PROPOFOL 10 MG/ML IV EMUL
INTRAVENOUS | Status: DC | PRN
  Administered 2011-04-01: 150 mg via INTRAVENOUS

## 2011-04-01 MED ORDER — VANCOMYCIN HCL IN DEXTROSE 1-5 GM/200ML-% IV SOLN
1000.0000 mg | Freq: Two times a day (BID) | INTRAVENOUS | Status: DC
  Administered 2011-04-01: 1000 mg via INTRAVENOUS
  Filled 2011-04-01 (×3): qty 200

## 2011-04-01 MED ORDER — PHENOL 1.4 % MT LIQD: 1.0000 | OROMUCOSAL | Status: DC | PRN

## 2011-04-01 MED ORDER — SODIUM CHLORIDE 0.9 % IR SOLN
Status: DC | PRN
  Administered 2011-04-01: 20:00:00

## 2011-04-01 MED ORDER — THROMBIN 5000 UNITS EX KIT
PACK | CUTANEOUS | Status: DC | PRN
  Administered 2011-04-01: 5000 [IU] via TOPICAL

## 2011-04-01 MED ORDER — MIDAZOLAM HCL 5 MG/5ML IJ SOLN
INTRAMUSCULAR | Status: DC | PRN
  Administered 2011-04-01: 2 mg via INTRAVENOUS

## 2011-04-01 MED ORDER — HYDROMORPHONE HCL PF 1 MG/ML IJ SOLN
0.5000 mg | INTRAMUSCULAR | Status: DC | PRN
  Administered 2011-04-01: 1 mg via INTRAVENOUS
  Filled 2011-04-01 (×2): qty 1

## 2011-04-01 MED ORDER — LIDOCAINE HCL (CARDIAC) 20 MG/ML IV SOLN
INTRAVENOUS | Status: DC | PRN
  Administered 2011-04-01: 60 mg via INTRAVENOUS

## 2011-04-01 MED ORDER — MEPERIDINE HCL 25 MG/ML IJ SOLN: 6.2500 mg | INTRAMUSCULAR | Status: DC | PRN

## 2011-04-01 MED ORDER — SODIUM CHLORIDE 0.9 % IJ SOLN: 3.0000 mL | INTRAMUSCULAR | Status: DC | PRN

## 2011-04-01 MED ORDER — TESTOSTERONE 20.25 MG/ACT (1.62%) TD GEL: 4.0000 | Freq: Every day | TRANSDERMAL | Status: DC

## 2011-04-01 MED ORDER — OXYCODONE-ACETAMINOPHEN 5-325 MG PO TABS: 1.0000 | ORAL_TABLET | ORAL | Status: DC | PRN

## 2011-04-01 MED ORDER — ZOLPIDEM TARTRATE 5 MG PO TABS: 10.0000 mg | ORAL_TABLET | Freq: Every evening | ORAL | Status: DC | PRN

## 2011-04-01 MED ORDER — KCL IN DEXTROSE-NACL 20-5-0.45 MEQ/L-%-% IV SOLN
INTRAVENOUS | Status: DC
  Administered 2011-04-01: via INTRAVENOUS
  Filled 2011-04-01 (×2): qty 1000

## 2011-04-01 MED ORDER — MORPHINE SULFATE 15 MG PO TABS
30.0000 mg | ORAL_TABLET | ORAL | Status: DC | PRN
  Administered 2011-04-02 (×2): 30 mg via ORAL
  Filled 2011-04-01 (×2): qty 2

## 2011-04-01 MED ORDER — SODIUM CHLORIDE 0.9 % IR SOLN
Status: DC | PRN
  Administered 2011-04-01: 1000 mL

## 2011-04-01 MED ORDER — BUPIVACAINE HCL (PF) 0.5 % IJ SOLN
INTRAMUSCULAR | Status: DC | PRN
  Administered 2011-04-01: 9 mL

## 2011-04-01 MED ORDER — ACETAMINOPHEN 325 MG PO TABS: 650.0000 mg | ORAL_TABLET | ORAL | Status: DC | PRN

## 2011-04-01 MED ORDER — DOCUSATE SODIUM 100 MG PO CAPS
100.0000 mg | ORAL_CAPSULE | Freq: Two times a day (BID) | ORAL | Status: DC
  Administered 2011-04-01: 100 mg via ORAL
  Filled 2011-04-01: qty 1

## 2011-04-01 MED ORDER — VANCOMYCIN HCL 1000 MG IV SOLR
1000.0000 mg | INTRAVENOUS | Status: DC | PRN
  Administered 2011-04-01: 1 g via INTRAVENOUS

## 2011-04-01 MED ORDER — LIDOCAINE-EPINEPHRINE 1 %-1:100000 IJ SOLN
INTRAMUSCULAR | Status: DC | PRN
  Administered 2011-04-01: 9 mL

## 2011-04-01 MED ORDER — MENTHOL 3 MG MT LOZG: 1.0000 | LOZENGE | OROMUCOSAL | Status: DC | PRN

## 2011-04-01 SURGICAL SUPPLY — 73 items
BAG DECANTER FOR FLEXI CONT (MISCELLANEOUS) ×3 IMPLANT
BANDAGE GAUZE 4  KLING STR (GAUZE/BANDAGES/DRESSINGS) IMPLANT
BANDAGE GAUZE ELAST BULKY 4 IN (GAUZE/BANDAGES/DRESSINGS) IMPLANT
BENZOIN TINCTURE PRP APPL 2/3 (GAUZE/BANDAGES/DRESSINGS) IMPLANT
BLADE SURG 15 STRL LF DISP TIS (BLADE) IMPLANT
BLADE SURG 15 STRL SS (BLADE)
BLADE SURG ROTATE 9660 (MISCELLANEOUS) IMPLANT
BRUSH SCRUB EZ PLAIN DRY (MISCELLANEOUS) ×3 IMPLANT
BUR MATCHSTICK NEURO 3.0 LAGG (BURR) ×3 IMPLANT
BUR PRECISION FLUTE 5.0 (BURR) ×3 IMPLANT
CLOTH BEACON ORANGE TIMEOUT ST (SAFETY) ×3 IMPLANT
CONT SPEC 4OZ CLIKSEAL STRL BL (MISCELLANEOUS) ×3 IMPLANT
CORDS BIPOLAR (ELECTRODE) IMPLANT
DERMABOND ADVANCED (GAUZE/BANDAGES/DRESSINGS) ×1
DERMABOND ADVANCED .7 DNX12 (GAUZE/BANDAGES/DRESSINGS) ×2 IMPLANT
DRAPE C-ARM 42X72 X-RAY (DRAPES) ×3 IMPLANT
DRAPE EXTREMITY T 121X128X90 (DRAPE) IMPLANT
DRAPE INCISE IOBAN 66X45 STRL (DRAPES) ×3 IMPLANT
DRAPE INCISE IOBAN 85X60 (DRAPES) IMPLANT
DRAPE LAPAROTOMY 100X72X124 (DRAPES) ×3 IMPLANT
DRAPE POUCH INSTRU U-SHP 10X18 (DRAPES) ×3 IMPLANT
DRAPE SURG 17X23 STRL (DRAPES) ×3 IMPLANT
DRESSING TELFA 8X3 (GAUZE/BANDAGES/DRESSINGS) IMPLANT
ELECT REM PT RETURN 9FT ADLT (ELECTROSURGICAL) ×3
ELECTRODE REM PT RTRN 9FT ADLT (ELECTROSURGICAL) ×2 IMPLANT
ELEVATER PASSER (SPINAL CORD STIMULATOR) ×3
GAUZE SPONGE 4X4 16PLY XRAY LF (GAUZE/BANDAGES/DRESSINGS) ×3 IMPLANT
GENERATOR IPG (Generator) ×3 IMPLANT
GLOVE BIO SURGEON STRL SZ8 (GLOVE) ×3 IMPLANT
GLOVE BIOGEL PI IND STRL 8 (GLOVE) IMPLANT
GLOVE BIOGEL PI IND STRL 8.5 (GLOVE) ×2 IMPLANT
GLOVE BIOGEL PI INDICATOR 8 (GLOVE)
GLOVE BIOGEL PI INDICATOR 8.5 (GLOVE) ×1
GLOVE ECLIPSE 7.5 STRL STRAW (GLOVE) IMPLANT
GLOVE EXAM NITRILE LRG STRL (GLOVE) IMPLANT
GLOVE EXAM NITRILE MD LF STRL (GLOVE) IMPLANT
GLOVE EXAM NITRILE XL STR (GLOVE) IMPLANT
GLOVE EXAM NITRILE XS STR PU (GLOVE) IMPLANT
GOWN BRE IMP SLV AUR LG STRL (GOWN DISPOSABLE) ×3 IMPLANT
GOWN BRE IMP SLV AUR XL STRL (GOWN DISPOSABLE) ×3 IMPLANT
GOWN STRL REIN 2XL LVL4 (GOWN DISPOSABLE) IMPLANT
KIT BASIN OR (CUSTOM PROCEDURE TRAY) ×3 IMPLANT
KIT CHARGING (KITS) ×1
KIT CHARGING PRECISION NEURO (KITS) ×2 IMPLANT
KIT ROOM TURNOVER OR (KITS) ×3 IMPLANT
LEAD ARTISAN 50CM (Spinal Cord Stimulator) ×3 IMPLANT
NEEDLE HYPO 25X1 1.5 SAFETY (NEEDLE) ×3 IMPLANT
NS IRRIG 1000ML POUR BTL (IV SOLUTION) ×3 IMPLANT
PACK LAMINECTOMY NEURO (CUSTOM PROCEDURE TRAY) ×3 IMPLANT
PACK SURGICAL SETUP 50X90 (CUSTOM PROCEDURE TRAY) IMPLANT
PAD ARMBOARD 7.5X6 YLW CONV (MISCELLANEOUS) ×9 IMPLANT
PASSER ELEVATOR (SPINAL CORD STIMULATOR) ×2 IMPLANT
PROGRAMMER PATIENT (SPINAL CORD STIMULATOR) ×3 IMPLANT
SPONGE GAUZE 4X4 12PLY (GAUZE/BANDAGES/DRESSINGS) IMPLANT
SPONGE LAP 4X18 X RAY DECT (DISPOSABLE) IMPLANT
SPONGE SURGIFOAM ABS GEL SZ50 (HEMOSTASIS) ×3 IMPLANT
STAPLER SKIN PROX WIDE 3.9 (STAPLE) IMPLANT
STOCKINETTE 4X48 STRL (DRAPES) IMPLANT
STRIP CLOSURE SKIN 1/2X4 (GAUZE/BANDAGES/DRESSINGS) IMPLANT
SUT ETHILON 3 0 PS 1 (SUTURE) IMPLANT
SUT SILK 0 TIES 10X30 (SUTURE) ×3 IMPLANT
SUT SILK 2 0 FS (SUTURE) ×6 IMPLANT
SUT SILK 2 0 TIES 10X30 (SUTURE) IMPLANT
SUT VIC AB 0 CT1 18XCR BRD8 (SUTURE) ×2 IMPLANT
SUT VIC AB 0 CT1 8-18 (SUTURE) ×1
SUT VIC AB 2-0 CP2 18 (SUTURE) ×6 IMPLANT
SUT VIC AB 3-0 SH 8-18 (SUTURE) ×6 IMPLANT
SYR BULB 3OZ (MISCELLANEOUS) IMPLANT
SYR CONTROL 10ML LL (SYRINGE) IMPLANT
TOWEL OR 17X24 6PK STRL BLUE (TOWEL DISPOSABLE) ×3 IMPLANT
TOWEL OR 17X26 10 PK STRL BLUE (TOWEL DISPOSABLE) ×3 IMPLANT
UNDERPAD 30X30 INCONTINENT (UNDERPADS AND DIAPERS) IMPLANT
WATER STERILE IRR 1000ML POUR (IV SOLUTION) ×3 IMPLANT

## 2011-04-01 NOTE — H&P (Signed)
  Patient seen and examined.  No change from office H&P from 03/03/2011

## 2011-04-01 NOTE — Assessment & Plan Note (Addendum)
Pt. Denies pleuritic pain

## 2011-04-01 NOTE — Anesthesia Preprocedure Evaluation (Addendum)
Anesthesia Evaluation  Patient identified by MRN, date of birth, ID band Patient awake    Reviewed: Allergy & Precautions, H&P , NPO status , Patient's Chart, lab work & pertinent test results  Airway Mallampati: I TM Distance: >3 FB Neck ROM: full    Dental No notable dental hx. (+) Teeth Intact   Pulmonary neg pulmonary ROS,  clear to auscultation  Pulmonary exam normal       Cardiovascular neg cardio ROS regular Normal    Neuro/Psych Negative Neurological ROS  Negative Psych ROS   GI/Hepatic negative GI ROS, Neg liver ROS,   Endo/Other  Negative Endocrine ROS  Renal/GU negative Renal ROS  Genitourinary negative   Musculoskeletal   Abdominal   Peds  Hematology negative hematology ROS (+)   Anesthesia Other Findings   Reproductive/Obstetrics negative OB ROS                           Anesthesia Physical Anesthesia Plan  ASA: II  Anesthesia Plan: General   Post-op Pain Management:    Induction: Intravenous  Airway Management Planned: Oral ETT  Additional Equipment:   Intra-op Plan:   Post-operative Plan: Extubation in OR  Informed Consent: I have reviewed the patients History and Physical, chart, labs and discussed the procedure including the risks, benefits and alternatives for the proposed anesthesia with the patient or authorized representative who has indicated his/her understanding and acceptance.     Plan Discussed with: CRNA  Anesthesia Plan Comments:         Anesthesia Quick Evaluation

## 2011-04-01 NOTE — Anesthesia Procedure Notes (Signed)
Procedure Name: Intubation Date/Time: 04/01/2011 7:35 PM Performed by: Alanda Amass Pre-anesthesia Checklist: Patient identified, Timeout performed, Emergency Drugs available, Suction available and Patient being monitored Patient Re-evaluated:Patient Re-evaluated prior to inductionOxygen Delivery Method: Circle System Utilized Preoxygenation: Pre-oxygenation with 100% oxygen Intubation Type: IV induction Ventilation: Mask ventilation without difficulty Laryngoscope Size: Mac and 3 Grade View: Grade II Tube type: Oral Number of attempts: 1 Placement Confirmation: ETT inserted through vocal cords under direct vision,  positive ETCO2 and breath sounds checked- equal and bilateral Secured at: 22 cm Tube secured with: Tape Dental Injury: Teeth and Oropharynx as per pre-operative assessment

## 2011-04-01 NOTE — Assessment & Plan Note (Signed)
Lungs clear. Denies wheezing, SOB

## 2011-04-01 NOTE — Progress Notes (Signed)
Please note allergy to pcn, questional rash when pt. Was a baby

## 2011-04-01 NOTE — H&P (Signed)
Spencer Castro is an 45 y.o. male.   Chief Complaint: Chronic pain with malfunctioning SCS HPI: Patient had bilateral lower extremity pain and none functioning spinal cord stimulator. He has chronic back and right greater than left Lorch from the pain secondary to previous laminectomy and fusion surgery with persistent radiculopathy. It is elected to admit the patient for revision of spinal cord stimulator.  Past Medical History  Diagnosis Date  . Prostate enlargement   . Anxiety     takes valium  . Carpal tunnel syndrome     Past Surgical History  Procedure Date  . Back surgery 2002    Lumbar fusion l4-5  . Lumbar fusion 2002  . Spinal cord stimulator insertion 2008  . Carpal tunnel release 2012    left elbow and wrist    No family history on file. Social History:  reports that he quit smoking about 17 years ago. His smokeless tobacco use includes Snuff. He reports that he does not drink alcohol or use illicit drugs.  Allergies:  Allergies  Allergen Reactions  . Nabumetone Other (See Comments)    Severe stomach pain and cramping  . Penicillins Other (See Comments)    ? Rash; had it as a baby.    Medications Prior to Admission  Medication Dose Route Frequency Provider Last Rate Last Dose  . ceFAZolin (ANCEF) IVPB 1 g/50 mL premix  1 g Intravenous 60 min Pre-Op Eugene Garnet, PHARMD      . HYDROmorphone (DILAUDID) injection 0.25-0.5 mg  0.25-0.5 mg Intravenous Q5 min PRN Zenon Mayo, MD      . meperidine (DEMEROL) injection 6.25-12.5 mg  6.25-12.5 mg Intravenous PRN Zenon Mayo, MD      . morphine (MSIR) tablet 30 mg  30 mg Oral Once Rivka Barbara, MD   30 mg at 04/01/11 1655  . promethazine (PHENERGAN) injection 6.25-12.5 mg  6.25-12.5 mg Intravenous Q15 min PRN Zenon Mayo, MD       Medications Prior to Admission  Medication Sig Dispense Refill  . morphine (MSIR) 30 MG tablet Take 30 mg by mouth every 4 (four) hours as needed. PAIN         No results found for this or any previous visit (from the past 48 hour(s)). No results found.  ROS  Blood pressure 107/74, pulse 70, temperature 97.8 F (36.6 C), temperature source Oral, resp. rate 18, weight 75 kg (165 lb 5.5 oz), SpO2 99.00%. Physical Exam  A & O x3 Full strength Bilateral upper and lower extremities. No numbness. Chest CTA Cor RRR Abd soft, NT. Exts without edema clubbing or cyanosis.  Assessment/Plan Surgery for revision of spinal cord stimulator.  Aletha Allebach D 04/01/2011, 7:04 PM

## 2011-04-01 NOTE — Op Note (Signed)
04/01/2011  9:24 PM  PATIENT:  Spencer Castro  45 y.o. male  PRE-OPERATIVE DIAGNOSIS:  POST LAMINECTOMY SYNDROME  POST-OPERATIVE DIAGNOSIS:  post laminectomy syndrome  PROCEDURE:  Procedure(s): LUMBAR SPINAL CORD STIMULATOR INSERTION  SURGEON:  Surgeon(s): Dorian Heckle, MD  PHYSICIAN ASSISTANT:   ASSISTANTS: none   ANESTHESIA:   general  EBL:  Total I/O In: 2350 [I.V.:2350] Out: -   BLOOD ADMINISTERED:none  DRAINS: none   LOCAL MEDICATIONS USED:  LIDOCAINE 9CC  SPECIMEN:  No Specimen  DISPOSITION OF SPECIMEN:  N/A  COUNTS:  YES  TOURNIQUET:  * No tourniquets in log *  DICTATION: Patient was brought to the operating room and following induction of general anesthesia he was turned in a prone position on the Wilson frame. C-arm was used 2 view the epidural spinal electrode. At and right flank were prepped and draped in the usual sterile fashion with Betadine scrub and paint. Previous thoracic incision was opened the previously placed paddle lead was exposed as it entered the spinal canal. A laminotomy was performed and the lead was removed. An initial effort was made to place the lead caudad but this was too low in the spine and subsequently a new lead was placed cephalad and was found to cover T8 and T9 levels. The leads were passed to the generator pocket had been opened and the previous generator was removed along with all previously placed hardware. A new precision  rechargeable implantable pulse generator was placed. Leads were hooked to the IPG. Impedances were checked and found to be correct. All wounds were irrigated and closed with 0, 2-0 in 3-0 Vicryl stitches. A sterile occlusive dressing was placed. Counts were correct at the end of the case. Vision was extubated and taken to recovery in stable and satisfactory condition.  PLAN OF CARE: Admit for overnight observation  PATIENT DISPOSITION:  PACU - hemodynamically stable.   Delay start of Pharmacological VTE  agent (>24hrs) due to surgical blood loss or risk of bleeding: YES

## 2011-04-01 NOTE — Transfer of Care (Signed)
Immediate Anesthesia Transfer of Care Note  Patient: Spencer Castro  Procedure(s) Performed:  LUMBAR SPINAL CORD STIMULATOR INSERTION - REVISION OF SPINAL CORD STIMULATOR WITH INTERNAL PULSE GENERATOR  Patient Location: PACU  Anesthesia Type: General  Level of Consciousness: sedated  Airway & Oxygen Therapy: Patient Spontanous Breathing and Patient connected to nasal cannula oxygen  Post-op Assessment: Report given to PACU RN  Post vital signs: stable  Complications: No apparent anesthesia complications

## 2011-04-01 NOTE — Anesthesia Postprocedure Evaluation (Signed)
  Anesthesia Post-op Note  Patient: Spencer Castro  Procedure(s) Performed:  LUMBAR SPINAL CORD STIMULATOR INSERTION - REVISION OF SPINAL CORD STIMULATOR WITH INTERNAL PULSE GENERATOR  Patient Location: PACU  Anesthesia Type: General  Level of Consciousness: awake and sedated  Airway and Oxygen Therapy: Patient Spontanous Breathing and Patient connected to nasal cannula oxygen  Post-op Pain: mild  Post-op Assessment: Post-op Vital signs reviewed, Patient's Cardiovascular Status Stable, Respiratory Function Stable and Patent Airway  Post-op Vital Signs: stable  Complications: No apparent anesthesia complications

## 2011-04-02 NOTE — Discharge Summary (Signed)
Physician Discharge Summary  Patient ID: SAVAS ELVIN MRN: [id number] DOB/AGE: Oct 30, 1965 45 y.o.  Admit date: 04/01/2011 Discharge date: 04/02/2011  Admission Diagnoses:  Discharge Diagnoses:  Active Problems:  * No active hospital problems. *    Discharged Condition: good  Hospital Course: Patient was admitted to undergo revision of a placed in the thoracic spine. Tolerated surgery well  Incisions clean and dry ready for discharge.   Consults: none  Significant Diagnostic Studies: None   Treatments: revision of spinal cord stimulator   Discharge Exam: Blood pressure 115/80, pulse 65, temperature 97.8 F (36.6 C), temperature source Oral, resp. rate 16, weight 75 kg (165 lb 5.5 oz), SpO2 98.00%. Alert oriented in no obvious distress midline thoracic incision dry and clean right flank incision dry and clean  Disposition:   Discharge Orders    Future Orders Please Complete By Expires   Diet - low sodium heart healthy      Increase activity slowly      Discharge instructions      Comments:   Patient may shower pad incisions dry and leave open to air. Do not apply any salves. Not soak in bathtub. Activity as tolerated.   Call MD for:  temperature >100.4      Call MD for:  severe uncontrolled pain      Call MD for:  redness, tenderness, or signs of infection (pain, swelling, redness, odor or green/yellow discharge around incision site)        Current Discharge Medication List    CONTINUE these medications which have NOT CHANGED   Details  diazepam (VALIUM) 10 MG tablet Take 10 mg by mouth every 8 (eight) hours as needed. For anxiety      levETIRAcetam (KEPPRA) 250 MG tablet Take 250 mg by mouth 2 (two) times daily.      morphine (MSIR) 30 MG tablet Take 30 mg by mouth every 4 (four) hours as needed. PAIN    Testosterone (ANDROGEL PUMP) 20.25 MG/ACT (1.62%) GEL Place 4 Squirts onto the skin daily.      acetaminophen (TYLENOL) 500 MG tablet Take 1,000 mg by mouth  every 6 (six) hours as needed. For pain     ibuprofen (ADVIL,MOTRIN) 200 MG tablet Take 600 mg by mouth every 8 (eight) hours as needed. For pain          Signed: Stefani Dama 04/02/2011, 9:09 AM

## 2011-04-05 ENCOUNTER — Encounter (HOSPITAL_COMMUNITY): Payer: Self-pay | Admitting: Neurosurgery

## 2011-11-07 ENCOUNTER — Encounter (HOSPITAL_BASED_OUTPATIENT_CLINIC_OR_DEPARTMENT_OTHER): Payer: Self-pay | Admitting: *Deleted

## 2011-11-07 ENCOUNTER — Other Ambulatory Visit: Payer: Self-pay | Admitting: Orthopedic Surgery

## 2011-11-07 NOTE — Progress Notes (Signed)
Pt was here last yr No labs needed

## 2011-11-08 ENCOUNTER — Ambulatory Visit (HOSPITAL_BASED_OUTPATIENT_CLINIC_OR_DEPARTMENT_OTHER)
Admission: RE | Admit: 2011-11-08 | Discharge: 2011-11-08 | Disposition: A | Discharge status: Home / Self Care | Payer: BC Managed Care – PPO | Source: Ambulatory Visit | Attending: Orthopedic Surgery | Admitting: Orthopedic Surgery

## 2011-11-08 ENCOUNTER — Encounter (HOSPITAL_BASED_OUTPATIENT_CLINIC_OR_DEPARTMENT_OTHER)
Admission: RE | Disposition: A | Discharge status: Home / Self Care | Payer: Self-pay | Source: Ambulatory Visit | Attending: Orthopedic Surgery

## 2011-11-08 ENCOUNTER — Encounter (HOSPITAL_BASED_OUTPATIENT_CLINIC_OR_DEPARTMENT_OTHER): Payer: Self-pay | Admitting: Certified Registered"

## 2011-11-08 ENCOUNTER — Encounter (HOSPITAL_BASED_OUTPATIENT_CLINIC_OR_DEPARTMENT_OTHER): Payer: Self-pay | Admitting: Anesthesiology

## 2011-11-08 ENCOUNTER — Ambulatory Visit (HOSPITAL_BASED_OUTPATIENT_CLINIC_OR_DEPARTMENT_OTHER): Payer: BC Managed Care – PPO | Admitting: Anesthesiology

## 2011-11-08 ENCOUNTER — Encounter (HOSPITAL_BASED_OUTPATIENT_CLINIC_OR_DEPARTMENT_OTHER): Payer: Self-pay | Admitting: Orthopedic Surgery

## 2011-11-08 DIAGNOSIS — L03119 Cellulitis of unspecified part of limb: Secondary | ICD-10-CM | POA: Insufficient documentation

## 2011-11-08 DIAGNOSIS — L02519 Cutaneous abscess of unspecified hand: Secondary | ICD-10-CM | POA: Insufficient documentation

## 2011-11-08 DIAGNOSIS — F411 Generalized anxiety disorder: Secondary | ICD-10-CM | POA: Insufficient documentation

## 2011-11-08 HISTORY — DX: Other chronic pain: G89.29

## 2011-11-08 HISTORY — PX: I&D EXTREMITY: SHX5045

## 2011-11-08 HISTORY — DX: Dorsalgia, unspecified: M54.9

## 2011-11-08 SURGERY — IRRIGATION AND DEBRIDEMENT EXTREMITY
Anesthesia: General | Site: Hand | Laterality: Left | Wound class: Dirty or Infected

## 2011-11-08 MED ORDER — FENTANYL CITRATE 0.05 MG/ML IJ SOLN
INTRAMUSCULAR | Status: DC | PRN
  Administered 2011-11-08: 100 ug via INTRAVENOUS

## 2011-11-08 MED ORDER — LIDOCAINE HCL (CARDIAC) 20 MG/ML IV SOLN
INTRAVENOUS | Status: DC | PRN
  Administered 2011-11-08: 100 mg via INTRAVENOUS

## 2011-11-08 MED ORDER — ONDANSETRON HCL 4 MG/2ML IJ SOLN: 4.0000 mg | Freq: Once | INTRAMUSCULAR | Status: DC | PRN

## 2011-11-08 MED ORDER — PROPOFOL 10 MG/ML IV EMUL
INTRAVENOUS | Status: DC | PRN
  Administered 2011-11-08: 300 mg via INTRAVENOUS

## 2011-11-08 MED ORDER — METOCLOPRAMIDE HCL 5 MG/ML IJ SOLN
INTRAMUSCULAR | Status: DC | PRN
  Administered 2011-11-08: 10 mg via INTRAVENOUS

## 2011-11-08 MED ORDER — LACTATED RINGERS IV SOLN
INTRAVENOUS | Status: DC
  Administered 2011-11-08: 08:00:00 via INTRAVENOUS

## 2011-11-08 MED ORDER — VANCOMYCIN HCL 1000 MG IV SOLR
1000.0000 mg | INTRAVENOUS | Status: DC | PRN
  Administered 2011-11-08: 1000 mg via INTRAVENOUS

## 2011-11-08 MED ORDER — SUCCINYLCHOLINE CHLORIDE 20 MG/ML IJ SOLN
INTRAMUSCULAR | Status: DC | PRN
  Administered 2011-11-08: 100 mg via INTRAVENOUS

## 2011-11-08 MED ORDER — HYDROMORPHONE HCL PF 1 MG/ML IJ SOLN
0.2500 mg | INTRAMUSCULAR | Status: DC | PRN
  Administered 2011-11-08: 0.25 mg via INTRAVENOUS
  Administered 2011-11-08: 0.5 mg via INTRAVENOUS

## 2011-11-08 MED ORDER — ONDANSETRON HCL 4 MG/2ML IJ SOLN
INTRAMUSCULAR | Status: DC | PRN
  Administered 2011-11-08: 4 mg via INTRAVENOUS

## 2011-11-08 MED ORDER — MIDAZOLAM HCL 5 MG/5ML IJ SOLN
INTRAMUSCULAR | Status: DC | PRN
  Administered 2011-11-08: 2 mg via INTRAVENOUS

## 2011-11-08 SURGICAL SUPPLY — 57 items
BAG DECANTER FOR FLEXI CONT (MISCELLANEOUS) IMPLANT
BANDAGE ADHESIVE 1X3 (GAUZE/BANDAGES/DRESSINGS) IMPLANT
BANDAGE ELASTIC 3 VELCRO ST LF (GAUZE/BANDAGES/DRESSINGS) ×2 IMPLANT
BANDAGE GAUZE ELAST BULKY 4 IN (GAUZE/BANDAGES/DRESSINGS) ×2 IMPLANT
BLADE MINI RND TIP GREEN BEAV (BLADE) IMPLANT
BLADE SURG 15 STRL LF DISP TIS (BLADE) ×1 IMPLANT
BLADE SURG 15 STRL SS (BLADE) ×1
BNDG COHESIVE 1X5 TAN STRL LF (GAUZE/BANDAGES/DRESSINGS) IMPLANT
BNDG ELASTIC 2 VLCR STRL LF (GAUZE/BANDAGES/DRESSINGS) IMPLANT
BNDG ESMARK 4X9 LF (GAUZE/BANDAGES/DRESSINGS) IMPLANT
BRUSH SCRUB EZ PLAIN DRY (MISCELLANEOUS) ×2 IMPLANT
CLOTH BEACON ORANGE TIMEOUT ST (SAFETY) ×2 IMPLANT
CORDS BIPOLAR (ELECTRODE) ×2 IMPLANT
COVER MAYO STAND STRL (DRAPES) ×2 IMPLANT
COVER TABLE BACK 60X90 (DRAPES) ×2 IMPLANT
CUFF TOURNIQUET SINGLE 18IN (TOURNIQUET CUFF) ×2 IMPLANT
DECANTER SPIKE VIAL GLASS SM (MISCELLANEOUS) IMPLANT
DRAIN PENROSE 1/2X12 LTX STRL (WOUND CARE) IMPLANT
DRAIN PENROSE 1/4X12 LTX STRL (WOUND CARE) IMPLANT
DRAPE EXTREMITY T 121X128X90 (DRAPE) ×2 IMPLANT
DRAPE SURG 17X23 STRL (DRAPES) ×2 IMPLANT
DRSG EMULSION OIL 3X3 NADH (GAUZE/BANDAGES/DRESSINGS) IMPLANT
DRSG PAD ABDOMINAL 8X10 ST (GAUZE/BANDAGES/DRESSINGS) IMPLANT
GAUZE PACKING IODOFORM 1/4X5 (PACKING) ×2 IMPLANT
GAUZE XEROFORM 1X8 LF (GAUZE/BANDAGES/DRESSINGS) IMPLANT
GLOVE BIOGEL M STRL SZ7.5 (GLOVE) ×2 IMPLANT
GLOVE BIOGEL PI IND STRL 8 (GLOVE) ×1 IMPLANT
GLOVE BIOGEL PI INDICATOR 8 (GLOVE) ×1
GLOVE ORTHO TXT STRL SZ7.5 (GLOVE) ×2 IMPLANT
GOWN PREVENTION PLUS XLARGE (GOWN DISPOSABLE) ×2 IMPLANT
GOWN PREVENTION PLUS XXLARGE (GOWN DISPOSABLE) ×4 IMPLANT
LOOP VESSEL MINI RED (MISCELLANEOUS) IMPLANT
NEEDLE 27GAX1X1/2 (NEEDLE) IMPLANT
NS IRRIG 1000ML POUR BTL (IV SOLUTION) ×2 IMPLANT
PACK BASIN DAY SURGERY FS (CUSTOM PROCEDURE TRAY) ×2 IMPLANT
PADDING CAST ABS 3INX4YD NS (CAST SUPPLIES)
PADDING CAST ABS 4INX4YD NS (CAST SUPPLIES) ×1
PADDING CAST ABS COTTON 3X4 (CAST SUPPLIES) IMPLANT
PADDING CAST ABS COTTON 4X4 ST (CAST SUPPLIES) ×1 IMPLANT
SLEEVE SURGEON STRL (DRAPES) IMPLANT
SPONGE GAUZE 4X4 12PLY (GAUZE/BANDAGES/DRESSINGS) ×2 IMPLANT
STOCKINETTE 4X48 STRL (DRAPES) ×2 IMPLANT
STRIP CLOSURE SKIN 1/2X4 (GAUZE/BANDAGES/DRESSINGS) IMPLANT
SUT ETHILON 5 0 P 3 18 (SUTURE)
SUT ETHILON 5 0 PS 2 18 (SUTURE) IMPLANT
SUT NYLON ETHILON 5-0 P-3 1X18 (SUTURE) IMPLANT
SUT PROLENE 3 0 PS 2 (SUTURE) IMPLANT
SWAB CULTURE LIQ STUART DBL (MISCELLANEOUS) IMPLANT
SYR 3ML 23GX1 SAFETY (SYRINGE) IMPLANT
SYR BULB 3OZ (MISCELLANEOUS) ×2 IMPLANT
SYR CONTROL 10ML LL (SYRINGE) IMPLANT
TOWEL OR 17X24 6PK STRL BLUE (TOWEL DISPOSABLE) ×2 IMPLANT
TRAY DSU PREP LF (CUSTOM PROCEDURE TRAY) ×2 IMPLANT
TUBE ANAEROBIC SPECIMEN COL (MISCELLANEOUS) IMPLANT
TUBE FEEDING 5FR 15 INCH (TUBING) IMPLANT
UNDERPAD 30X30 INCONTINENT (UNDERPADS AND DIAPERS) ×2 IMPLANT
WATER STERILE IRR 1000ML POUR (IV SOLUTION) IMPLANT

## 2011-11-08 NOTE — Anesthesia Postprocedure Evaluation (Signed)
  Anesthesia Post-op Note  Patient: Spencer Castro  Procedure(s) Performed: Procedure(s) (LRB): IRRIGATION AND DEBRIDEMENT EXTREMITY (Left)  Patient Location: PACU  Anesthesia Type: General  Level of Consciousness: awake and sedated  Airway and Oxygen Therapy: Patient Spontanous Breathing and Patient connected to face mask oxygen  Post-op Pain: none  Post-op Assessment: Post-op Vital signs reviewed  Post-op Vital Signs: Reviewed  Complications: No apparent anesthesia complications

## 2011-11-08 NOTE — Discharge Instructions (Signed)

## 2011-11-08 NOTE — Transfer of Care (Signed)
Immediate Anesthesia Transfer of Care Note  Patient: Spencer Castro  Procedure(s) Performed: Procedure(s) (LRB): IRRIGATION AND DEBRIDEMENT EXTREMITY (Left)  Patient Location: PACU  Anesthesia Type: General  Level of Consciousness: awake, sedated and patient cooperative  Airway & Oxygen Therapy: Patient Spontanous Breathing and Patient connected to face mask oxygen  Post-op Assessment: Report given to PACU RN and Post -op Vital signs reviewed and stable  Post vital signs: Reviewed and stable  Complications: No apparent anesthesia complications

## 2011-11-08 NOTE — Anesthesia Procedure Notes (Signed)
Procedure Name: Intubation Date/Time: 11/08/2011 8:29 AM Performed by: Verlan Friends Pre-anesthesia Checklist: Patient identified, Emergency Drugs available, Suction available, Patient being monitored and Timeout performed Patient Re-evaluated:Patient Re-evaluated prior to inductionOxygen Delivery Method: Circle System Utilized Preoxygenation: Pre-oxygenation with 100% oxygen Intubation Type: IV induction Ventilation: Mask ventilation without difficulty Laryngoscope Size: Miller and 3 Grade View: Grade I Tube type: Oral Tube size: 8.0 mm Number of attempts: 1 Airway Equipment and Method: stylet and oral airway Placement Confirmation: ETT inserted through vocal cords under direct vision,  positive ETCO2 and breath sounds checked- equal and bilateral Secured at: 22 cm Tube secured with: Tape Dental Injury: Teeth and Oropharynx as per pre-operative assessment

## 2011-11-08 NOTE — Op Note (Signed)
[id number] 

## 2011-11-08 NOTE — Op Note (Signed)
NAMEKATELYN, KOHLMEYER NO.:  [id number]  MEDICAL RECORD NO.:  [id number]  LOCATION:                                 FACILITY:  PHYSICIAN:  Katy Fitch. Akeya Ryther, M.D.      DATE OF BIRTH:  DATE OF PROCEDURE:  11/08/2011 DATE OF DISCHARGE:                              OPERATIVE REPORT   PREOPERATIVE DIAGNOSIS:  Severe left hand hypothenar abscess, status post partial incision and drainage performed November 07, 2011, under local anesthesia in the office with a subsequent dorsal extension of abscess.  POSTOPERATIVE DIAGNOSIS:  Severe left hand hypothenar abscess, status post partial incision and drainage performed November 07, 2011, under local anesthesia in the office with a subsequent dorsal extension of abscess.  OPERATION:  Aggressive incision and drainage of left hand hypothenar abscess with subsequent incision and drainage of 2 areas of dorsal dissection and purulent fluid collection in the subfascial space of the left hand.  OPERATING SURGEON:  Katy Fitch. Angelyna Henderson, M.D.  ASSISTANT:  Marveen Reeks Dasnoit, PA-C  ANESTHESIA:  General by endotracheal technique.  SUPERVISED ANESTHESIOLOGIST:  Dr. Angelina Ok.  INDICATIONS:  Spencer Castro is a 46 year old gentleman well acquainted with our practice.  Eight days prior, he noted a area of inflammation in the hypothenar eminence of his left hand.  He had no known history of penetrating injury.  This developed a "white head".  Mr. Robb decided to drain this at home and after alcohol prep with the skin used some type of instrument to pierce his skin.  Accomplished with this maneuver was inoculating his hypothenar eminence. He subsequently developed a tense abscess.  He presented to his primary care physician, Dr. Waynard Edwards and was thought to have a possible foreign body.  He was referred to our office with a request for an x-ray looking for a foreign body.  What we identified was a 4 cm diameter tense abscess in the hypothenar  eminence of his left hand.  Mr. Forgette had been eating and had, had coffee and, therefore, was not a candidate for anesthesia late on the afternoon of November 07, 2011.  We advised him that we would do our best effort to drainage in the office under a wrist block anesthesia.  After detailed informed consent, approximately 5 mL of 2% lidocaine were infiltrated around the ulnar nerve in the distal forearm and on the dorsal aspect of the hand.  Individuals that have tense abbesses are notoriously difficult to obtain regional anesthesia.  We are able to obtain partial anesthesia which allowed [place] in the office under sterile conditions, following Betadine prep of the left hand to incise the abscess, recover purulent material for culture and placed an iodoform drain.  There was some concern about dorsal dissection; therefore, I advised Mr. Mcmanaman to begin oral sulfa therapy in the form of Septra DS 1 p.o. b.i.d.  He was noted to be a fair bowl during the evening and was asked to return at 7 a.m. this morning for re-evaluation of his wound.  Upon re-evaluation at the [facility], he was noted to have 2 areas of dorsal dissection of purulent material.  One was more  proximal, 1 was directly dorsal to the abscess.  We advised to proceed with incision and drainage of his hypothenar eminence under general anesthesia.  He was interviewed by Dr. Sondra Come of Anesthesia preoperatively.  General anesthesia by endotracheal technique was recommended due to the fact that Mr. Cihlar despite being advised by myself, my physician assistant, and our office x-ray tech who arranged for his surgery to be n.p.o. still had, had some candy this morning and there was no certainty that it was truly n.p.o.  Dr. Sondra Come realizing the gravity of the infection situation, agreed to proceed with endotracheal anesthesia at this time.  PROCEDURE:  Jujhar Everett was interviewed in the holding area and reminded of the  potential risks and benefits of the anticipated procedure.  In room 8 under Dr. Shon Hough direct supervision, general anesthesia was induced by endotracheal technique with rapid sequence.  There are no issues with reflux or regurgitation.  The left upper extremity is then prepped with Betadine soap solution, sterilely draped.  A pneumatic tourniquet was applied proximal left brachium.  Following elevation of the limb for 1 minute, the arterial tourniquet on the proximal brachium was inflated to 220 mmHg.  The prior office incision was extended to approximately 3 cm.  An aggressive undermining of the abscess cavity was accomplished recovering purulent material from the subcutaneous fat.  By careful palpation, identified 2 areas of dorsal dissection that were drained by through and through technique with 2 dorsal counter incisions and passage of the instrument through and through.  All purulent material was recovered.  The wound was thoroughly lavaged with sterile saline.  The 2 dorsal extensions were packed with iodoform through separate incisions and the main abscess cavity was packed with iodoform from a palmar approach.  The wound was dressed open with sterile gauze, sterile Kerlix, and Ace wrap.  Mr. Auman was provided 1 g of vancomycin as an IV antibiotic, following the incision and drainage.  He will continue on his oral Septra DS at home.  We will check his Gram stain, cultures and adjust his antibiotic therapy appropriately.     Katy Fitch Esmay Amspacher, M.D.     RVS/MEDQ  D:  11/08/2011  T:  11/08/2011  Job:  [id number]  cc:   Danae Orleans. Venetia Maxon, M.D.

## 2011-11-08 NOTE — Anesthesia Preprocedure Evaluation (Signed)
Anesthesia Evaluation  Patient identified by MRN, date of birth, ID band Patient awake    Reviewed: Allergy & Precautions, H&P , NPO status , Patient's Chart, lab work & pertinent test results  Airway Mallampati: I TM Distance: >3 FB Neck ROM: Full    Dental  (+) Teeth Intact and Dental Advisory Given   Pulmonary  breath sounds clear to auscultation        Cardiovascular Rhythm:Regular Rate:Normal     Neuro/Psych    GI/Hepatic Pt admits to having a "piece of candy" this am.  Tongue is blue.   Endo/Other    Renal/GU      Musculoskeletal   Abdominal   Peds  Hematology   Anesthesia Other Findings   Reproductive/Obstetrics                           Anesthesia Physical Anesthesia Plan  ASA: II  Anesthesia Plan: General   Post-op Pain Management:    Induction: Intravenous and Rapid sequence  Airway Management Planned: Oral ETT  Additional Equipment:   Intra-op Plan:   Post-operative Plan: Extubation in OR  Informed Consent: I have reviewed the patients History and Physical, chart, labs and discussed the procedure including the risks, benefits and alternatives for the proposed anesthesia with the patient or authorized representative who has indicated his/her understanding and acceptance.   Dental advisory given  Plan Discussed with: CRNA, Anesthesiologist and Surgeon  Anesthesia Plan Comments: (I question the NPO status of this pt and will plan as above.)        Anesthesia Quick Evaluation

## 2011-11-08 NOTE — Brief Op Note (Signed)
11/08/2011  8:56 AM  PATIENT:  Merlyn Albert  46 y.o. male  PRE-OPERATIVE DIAGNOSIS:  abscess left hand one week in duration  POST-OPERATIVE DIAGNOSIS:  abscess left hand dissecting into dorsum of hand  PROCEDURE: INCISION, DRAINAGE IRRIGATION AND DEBRIDEMENT LEFT HYPOTHENAR EMINENCE   SURGEON:  Surgeon(s) and Role:    * Wyn Forster., MD - Primary  PHYSICIAN ASSISTANT:   ASSISTANTS:Sallee Hogrefe Dasnoit,P.A-C   ANESTHESIA:   general  EBL:  Total I/O In: 100 [I.V.:100] Out: -   BLOOD ADMINISTERED:none  DRAINS: none   LOCAL MEDICATIONS USED:  NONE  SPECIMEN:  No Specimen  DISPOSITION OF SPECIMEN:  N/A  COUNTS:  YES  TOURNIQUET:   Total Tourniquet Time Documented: Upper Arm (Left) - 9 minutes  DICTATION: .Other Dictation: Dictation Number [id number]  PLAN OF CARE: Discharge to home after PACU  PATIENT DISPOSITION:  PACU - hemodynamically stable.

## 2011-11-08 NOTE — H&P (Signed)
Spencer Castro is an 46 y.o. male.   Chief Complaint: c/o painful abscess hypothenar area left hand HPI: patient is a 46 y/o right handed male who noted an area of painful swelling of the hypothenar area left hand 6 days ago. The following day he opened the wound to let it drain. He did not have a bandage to place on the wound however. Later that evening he started hydrogen peroxide and alcohol soaks. He noted progressive pain and swelling through the weekend. On Monday 11/07/11 he presented to his primary care MD who gave him a tetanus injection and referred him to our office. He reported no fevers or night sweats. We performed an I&D in our office but were unable to obtain full anesthesia due to the extensive infection. He was placed on po Septra and told to present to the [facility] day surgery center for further evaluation on Tuesday 11/08/11.He was found to have an area of tense abscess that needed additional I&D.  Past Medical History  Diagnosis Date  . Prostate enlargement   . Anxiety     takes valium  . Carpal tunnel syndrome   . Chronic back pain     Past Surgical History  Procedure Date  . Back surgery 2002    Lumbar fusion l4-5  . Lumbar fusion 2002  . Spinal cord stimulator insertion 2008  . Carpal tunnel release 2012    left elbow and wrist  . Spinal cord stimulator insertion 04/01/2011    Procedure: LUMBAR SPINAL CORD STIMULATOR INSERTION;  Surgeon: Dorian Heckle, MD;  Location: [facility];  Service: Neurosurgery;  Laterality: N/A;  REVISION OF SPINAL CORD STIMULATOR WITH INTERNAL PULSE GENERATOR    History reviewed. No pertinent family history. Social History:  reports that he quit smoking about 17 years ago. His smokeless tobacco use includes Snuff. He reports that he does not drink alcohol or use illicit drugs.  Allergies:  Allergies  Allergen Reactions  . Nabumetone Other (See Comments)    Severe stomach pain and cramping  . Penicillins Other (See Comments)    ? Rash;  had it as a baby.    Medications Prior to Admission  Medication Sig Dispense Refill  . HYDROmorphone (DILAUDID) 4 MG tablet Take 4 mg by mouth every 6 (six) hours as needed.      Marland Kitchen HYDROmorphone HCl (EXALGO) 8 MG TB24 Take 8 mg by mouth daily. Can have 2/day      . lisdexamfetamine (VYVANSE) 30 MG capsule Take 30 mg by mouth every morning.      . sulfamethoxazole-trimethoprim (BACTRIM DS) 800-160 MG per tablet Take 1 tablet by mouth 2 (two) times daily.      . diazepam (VALIUM) 10 MG tablet Take 10 mg by mouth every 8 (eight) hours as needed. For anxiety        . ibuprofen (ADVIL,MOTRIN) 200 MG tablet Take 600 mg by mouth every 8 (eight) hours as needed. For pain       . levETIRAcetam (KEPPRA) 250 MG tablet Take 250 mg by mouth 2 (two) times daily.       Marland Kitchen morphine (MSIR) 30 MG tablet Take 30 mg by mouth every 4 (four) hours as needed. PAIN      . Testosterone (ANDROGEL PUMP) 20.25 MG/ACT (1.62%) GEL Place 4 Squirts onto the skin daily.          No results found for this or any previous visit (from the past 48 hour(s)).  No results found.  Pertinent items are noted in HPI.  There were no vitals taken for this visit.  General appearance: alert Head: Normocephalic, without obvious abnormality Neck: supple, symmetrical, trachea midline Resp: clear to auscultation bilaterally Cardio: regular rate and rhythm GI: normal findings: bowel sounds normal Extremities:Left hand reveals tense abscess ulnar border of the hypothenar area. This is actively draining and remains painful to palpation. Neurovascular exam WNL. Pulses: 2+ and symmetric Skin: dry without lesions other than left hand.  Tension in abscess persists Neurologic: Grossly normal    Assessment/Plan Impression: Abscess left hand hypothenar area  Plan: To the OR for additional I&D hypothenar area left hand. The procedure, risks and post-op course were discussed with the patient at length and he was in agreement with the  plan.  DASNOIT,Shamond Skelton J 11/08/2011, 7:52 AM    H&P documentation: 11/08/2011  -History and Physical Reviewed  -Patient has been re-examined  -No change in the plan of care  Wyn Forster, MD

## 2011-11-09 ENCOUNTER — Encounter (HOSPITAL_BASED_OUTPATIENT_CLINIC_OR_DEPARTMENT_OTHER): Payer: Self-pay | Admitting: Orthopedic Surgery

## 2011-12-07 IMAGING — CR DG MYELOGRAM LUMBAR
12 of 17 series · 12 of 17 positions shown · non-contrast
Comparison: none

CLINICAL DATA: Spinal stimulator failure.  Right leg pain.
Herniated nucleus pulposus.  Lumbar disc displacement.  Prior
lumbar spinal fusion.
TECHNIQUE: Contiguous axial images were obtained through the lumbar
spine without infusion. Coronal, sagittal, and disc space
reconstructions were obtained of the axial image sets.

[[hospital]]
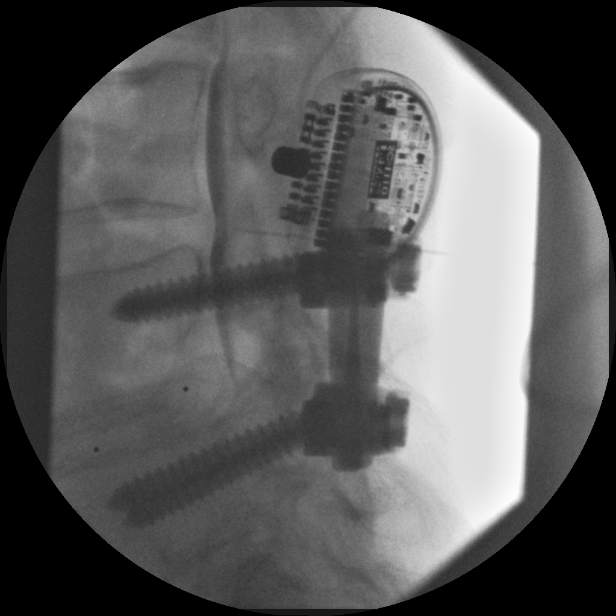

[myelogram  white (1 of 9)]
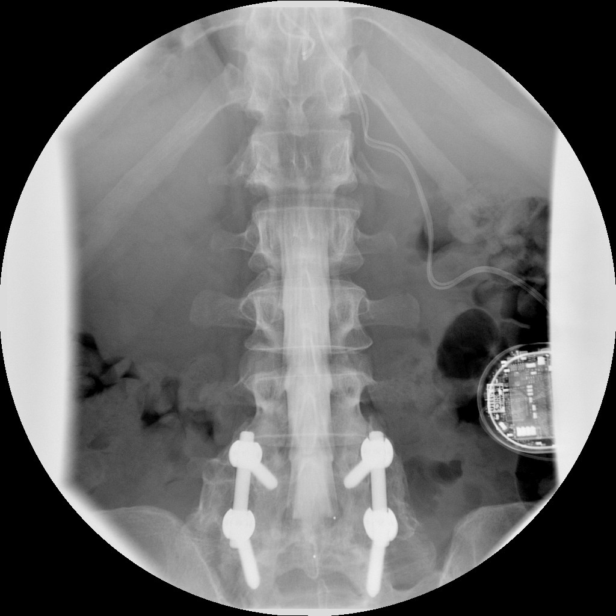

[myelogram  white (2 of 9)]
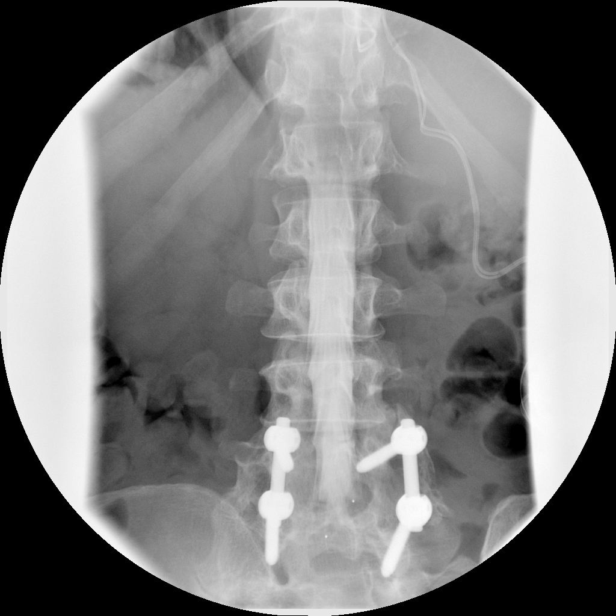

[myelogram  white (3 of 9)]
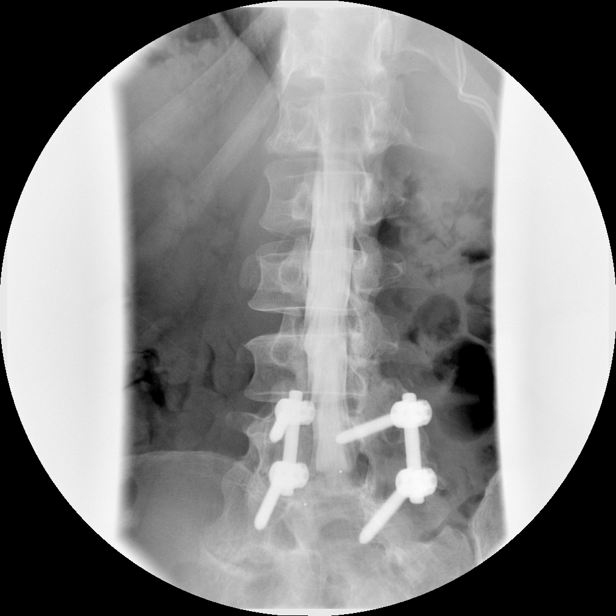

[myelogram  white (4 of 9)]
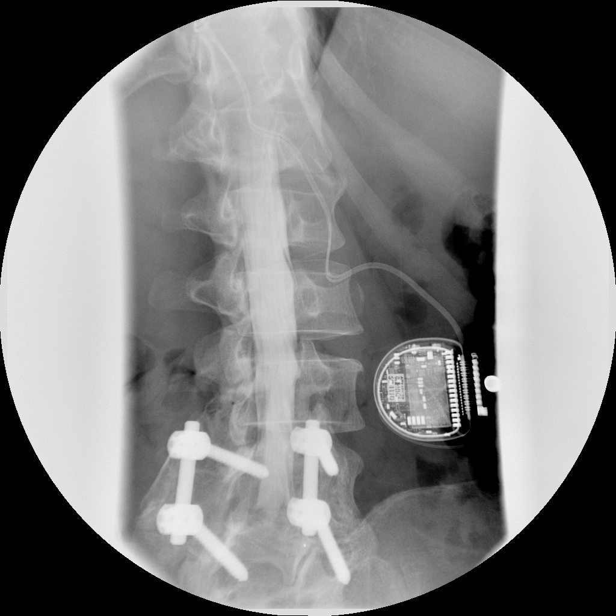

[myelogram  white (5 of 9)]
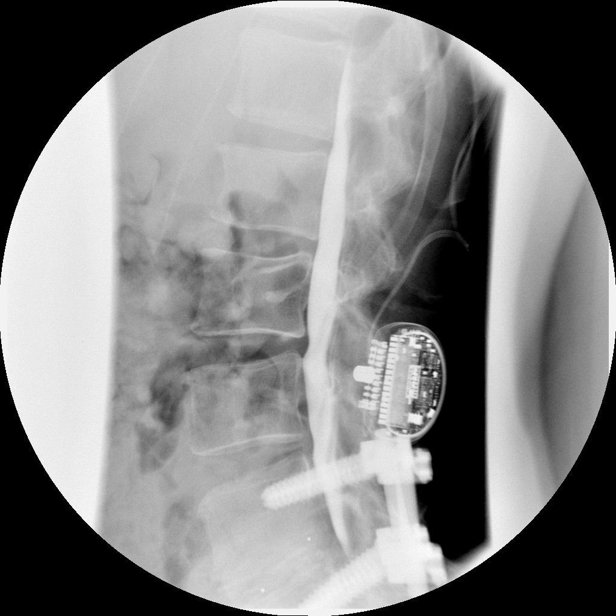

[myelogram  white (6 of 9)]
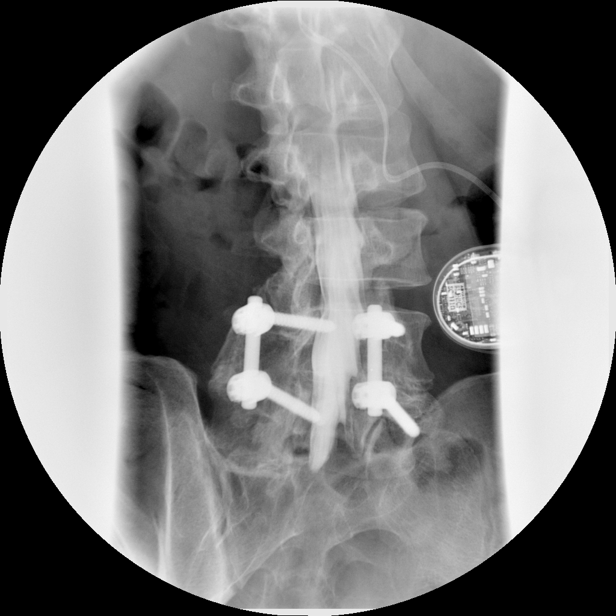

[myelogram  white (7 of 9)]
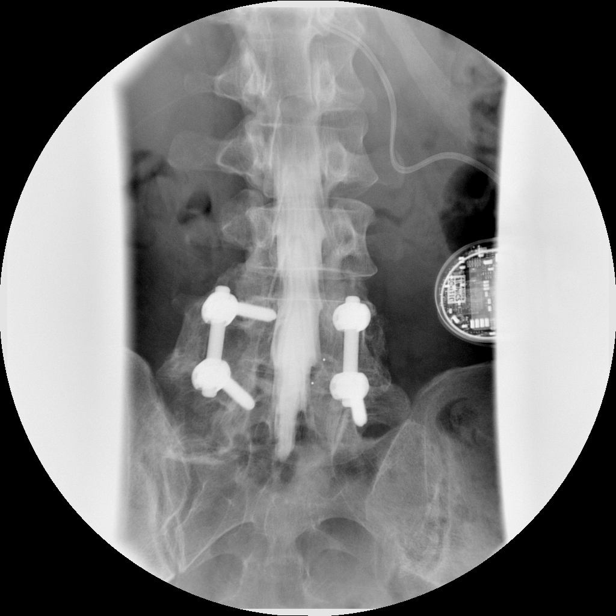

[myelogram  white (8 of 9)]
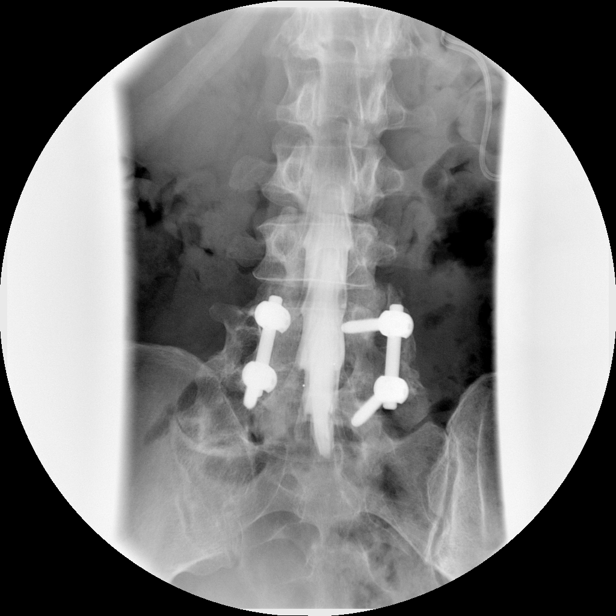

[myelogram  white (9 of 9)]
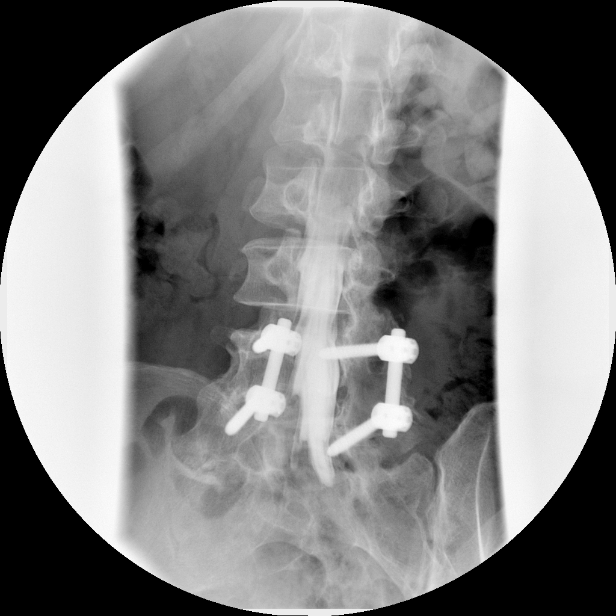

[view not recorded (1 of 2)]
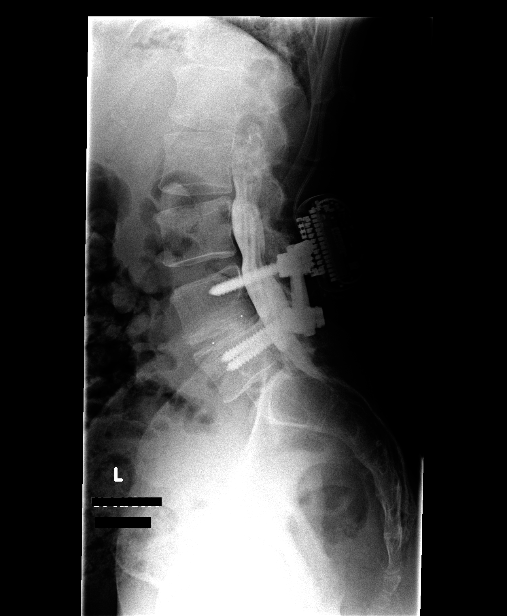

[view not recorded (2 of 2)]
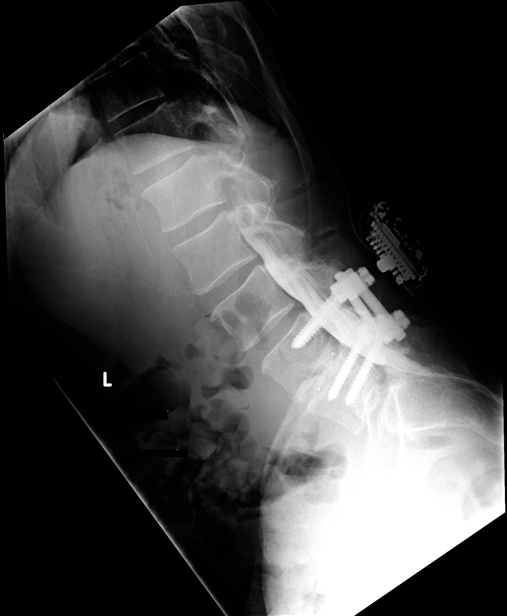

[12 of 17 positions shown; findings below may reference images not displayed]

LUMBAR MYELOGRAM

Procedure: After thorough discussion of risks and benefits of the
procedure including bleeding, infection, injury to nerves, blood
vessels, adjacent structures as well as headache and CSF leak,
written and oral informed consent was obtained.   Consent was
obtained by Dr.Tiger.

Patient was positioned prone on the fluoroscopy table. Local
anesthesia was provided with 1% lidocaine without epinephrine after
prepped and draped in the usual sterile fashion. Puncture was
performed at L3-L4 using a 3-1/2 inches 22-gauge spinal needle via
right paramedian approach.  Using a single pass through the dura,
the needle was placed within the thecal sac, with return of clear
CSF. 15 mL of Pmnipaque-Y1Z was injected into the thecal sac, with
normal opacification of the nerve roots and cauda equina consistent
with free flow within the subarachnoid space.

Fluoroscopy time: 48 seconds
FINDINGS: Rudimentary T12 ribs are present.  Spinal stimulator
appears tendon the spinal canal at T10-T11.  Posterior rod and
pedicle screw fixation is present at L4-L5.  Posterolateral fusion
graft is present which appears confluent.  There is no evidence of
hardware loosening or failure.  Shallow anterior extradural
impression is present at L2-L3 compatible with broad-based disc
bulge.  Discectomy at L4-L5 with completed fusion across the disc
space.  Graft appears well incorporated.  L5-S1 level appears
normal by myelogram.  There is no pathologic motion with flexion
and extension maneuvers.  The shallow anterior extradural
impression at L3-L4 is unchanged.  Shallow anterior extradural
impression at L2-L3 also appears unchanged with flexion and
extension.  Both of these levels demonstrate mild disc space loss.

The sacralization of the left L5 transverse process is incidentally
noted.
IMPRESSION: 1.  Successful lumbar puncture for lumbar myelogram.
2.  L4-L5 PLIF with completed fusion.
3.  Partially visualized spinal stimulator.
4.  Shallow broad-based disc bulging L2-L3 and L3-L4.

CT LUMBAR MYELOGRAM
FINDINGS: The lumbar spinal alignment is anatomic.  Spinal cord
terminates posterior to the L1 vertebra.  Vertebral body height is
preserved.  Paraspinal soft tissues are within normal limits.
Sacralization of the left L5 transverse process is present with
pseudoarthrosis between the sacrum and L5 transverse process
transverse process.  Mild SI joint degenerative disease is present
with vacuum joint bilaterally.

T12-L1:  Mild degenerative endplate changes.  No stenosis.

L1-L2:  Negative.

L2-L3:  Mild disc degeneration with shallow broad-based posterior
disc bulging extending into both neural foramina.  Mild central
stenosis.  Mild facet hypertrophy and ligamentum flavum redundancy.
There is no effacement of contrast in the nerve root sleeves.  The
L2 nerve roots at exit normally.

L3-L4:  Moderate bilateral facet degeneration and ligamentum flavum
redundancy.  Mild central stenosis.  Lateral recesses patent.
Minimal left and mild right foraminal stenosis associated with
facet hypertrophy and ligamentum flavum redundancy (image 51 series
3).

L4-L5:  Posterior decompression.  No recurrent stenosis.  Completed
fusion.  No hardware loosening or failure.

L5-S1:  Small central and right paracentral disc protrusion is
present with minimal mass effect on the thecal sac.  Contrast
remains present in both descending S1 nerve root sleeves.  The
foramina appear patent. Moderate left and mild right facet
arthrosis.  There is mild to moderate bony left foraminal stenosis
associated with pseudoarthrosis and hypertrophy.
IMPRESSION: 1.  Completed fusion L4-L5 with intact hardware.  No recurrent
stenosis.
2.  L3-L4 shallow broad-based disc bulge extending into both neural
foramina.  Moderate bilateral facet hypertrophy and right greater
than left ligamentum flavum redundancy.  Mild to moderate right
greater than left foraminal stenosis at L3-L4 associated with facet
disease and bulging disc.
3.  Central and right paracentral L5-S1 disc protrusion with
minimal mass effect on the ventral thecal sac. Sacralization of the
left L5 transverse process with mild to moderate bony left
foraminal stenosis associated with hypertrophy from
pseudoarthrosis.

## 2012-08-16 ENCOUNTER — Encounter (HOSPITAL_COMMUNITY): Payer: Self-pay | Admitting: Pharmacy Technician

## 2012-08-16 ENCOUNTER — Other Ambulatory Visit (HOSPITAL_COMMUNITY): Payer: Self-pay | Admitting: Neurosurgery

## 2012-08-16 ENCOUNTER — Other Ambulatory Visit: Payer: Self-pay | Admitting: Neurosurgery

## 2012-08-16 DIAGNOSIS — M549 Dorsalgia, unspecified: Secondary | ICD-10-CM

## 2012-08-21 ENCOUNTER — Ambulatory Visit (HOSPITAL_COMMUNITY)
Admission: RE | Admit: 2012-08-21 | Discharge: 2012-08-21 | Disposition: A | Discharge status: Home / Self Care | Payer: Worker's Compensation | Source: Ambulatory Visit | Attending: Neurosurgery | Admitting: Neurosurgery

## 2012-08-21 DIAGNOSIS — M549 Dorsalgia, unspecified: Secondary | ICD-10-CM

## 2012-08-21 DIAGNOSIS — M51379 Other intervertebral disc degeneration, lumbosacral region without mention of lumbar back pain or lower extremity pain: Secondary | ICD-10-CM | POA: Insufficient documentation

## 2012-08-21 DIAGNOSIS — Z981 Arthrodesis status: Secondary | ICD-10-CM | POA: Insufficient documentation

## 2012-08-21 DIAGNOSIS — M5137 Other intervertebral disc degeneration, lumbosacral region: Secondary | ICD-10-CM | POA: Insufficient documentation

## 2012-08-21 DIAGNOSIS — M47817 Spondylosis without myelopathy or radiculopathy, lumbosacral region: Secondary | ICD-10-CM | POA: Insufficient documentation

## 2012-08-21 MED ORDER — IOHEXOL 180 MG/ML  SOLN: 20.0000 mL | Freq: Once | INTRAMUSCULAR | Status: DC | PRN

## 2012-08-21 MED ORDER — DIAZEPAM 5 MG PO TABS
ORAL_TABLET | ORAL | Status: AC
  Administered 2012-08-21: 10 mg via ORAL
  Filled 2012-08-21: qty 2

## 2012-08-21 MED ORDER — ONDANSETRON HCL 4 MG/2ML IJ SOLN: 4.0000 mg | Freq: Four times a day (QID) | INTRAMUSCULAR | Status: DC | PRN

## 2012-08-21 MED ORDER — IOHEXOL 180 MG/ML  SOLN
20.0000 mL | Freq: Once | INTRAMUSCULAR | Status: AC | PRN
  Administered 2012-08-21: 20 mL via INTRATHECAL

## 2012-08-21 MED ORDER — OXYMORPHONE HCL ER 10 MG PO T12A
10.0000 mg | EXTENDED_RELEASE_TABLET | Freq: Once | ORAL | Status: AC
  Administered 2012-08-21: 10 mg via ORAL

## 2012-08-21 MED ORDER — DIAZEPAM 5 MG PO TABS
10.0000 mg | ORAL_TABLET | Freq: Once | ORAL | Status: AC
  Administered 2012-08-21: 10 mg via ORAL

## 2012-08-21 MED ORDER — OXYMORPHONE HCL ER 10 MG PO T12A
10.0000 mg | EXTENDED_RELEASE_TABLET | Freq: Once | ORAL | Status: DC
  Filled 2012-08-21: qty 1

## 2012-08-21 NOTE — Procedures (Signed)
L12 lumbar puncture Omnipaque 300

## 2012-11-06 DIAGNOSIS — M961 Postlaminectomy syndrome, not elsewhere classified: Secondary | ICD-10-CM | POA: Insufficient documentation

## 2013-04-23 DIAGNOSIS — M5417 Radiculopathy, lumbosacral region: Secondary | ICD-10-CM | POA: Insufficient documentation

## 2013-04-23 DIAGNOSIS — M545 Low back pain, unspecified: Secondary | ICD-10-CM | POA: Insufficient documentation

## 2013-04-23 DIAGNOSIS — G8929 Other chronic pain: Secondary | ICD-10-CM | POA: Insufficient documentation

## 2013-09-26 DIAGNOSIS — M5136 Other intervertebral disc degeneration, lumbar region: Secondary | ICD-10-CM | POA: Insufficient documentation

## 2013-09-26 DIAGNOSIS — M5417 Radiculopathy, lumbosacral region: Secondary | ICD-10-CM | POA: Insufficient documentation

## 2013-11-05 ENCOUNTER — Emergency Department (HOSPITAL_COMMUNITY)
Admission: EM | Admit: 2013-11-05 | Discharge: 2013-11-05 | Disposition: A | Discharge status: Home / Self Care | Payer: BC Managed Care – PPO | Attending: Emergency Medicine | Admitting: Emergency Medicine

## 2013-11-05 ENCOUNTER — Encounter (HOSPITAL_COMMUNITY): Payer: Self-pay | Admitting: Emergency Medicine

## 2013-11-05 DIAGNOSIS — R519 Headache, unspecified: Secondary | ICD-10-CM

## 2013-11-05 DIAGNOSIS — H538 Other visual disturbances: Secondary | ICD-10-CM | POA: Insufficient documentation

## 2013-11-05 DIAGNOSIS — Z87891 Personal history of nicotine dependence: Secondary | ICD-10-CM | POA: Insufficient documentation

## 2013-11-05 DIAGNOSIS — Y9289 Other specified places as the place of occurrence of the external cause: Secondary | ICD-10-CM | POA: Insufficient documentation

## 2013-11-05 DIAGNOSIS — F411 Generalized anxiety disorder: Secondary | ICD-10-CM | POA: Insufficient documentation

## 2013-11-05 DIAGNOSIS — W92XXXA Exposure to excessive heat of man-made origin, initial encounter: Secondary | ICD-10-CM | POA: Insufficient documentation

## 2013-11-05 DIAGNOSIS — Z87448 Personal history of other diseases of urinary system: Secondary | ICD-10-CM | POA: Insufficient documentation

## 2013-11-05 DIAGNOSIS — T679XXA Effect of heat and light, unspecified, initial encounter: Secondary | ICD-10-CM

## 2013-11-05 DIAGNOSIS — T678XXA Other effects of heat and light, initial encounter: Secondary | ICD-10-CM | POA: Insufficient documentation

## 2013-11-05 DIAGNOSIS — G8929 Other chronic pain: Secondary | ICD-10-CM | POA: Insufficient documentation

## 2013-11-05 DIAGNOSIS — R51 Headache: Secondary | ICD-10-CM | POA: Insufficient documentation

## 2013-11-05 DIAGNOSIS — Y990 Civilian activity done for income or pay: Secondary | ICD-10-CM | POA: Insufficient documentation

## 2013-11-05 DIAGNOSIS — Z79899 Other long term (current) drug therapy: Secondary | ICD-10-CM | POA: Insufficient documentation

## 2013-11-05 DIAGNOSIS — Y9389 Activity, other specified: Secondary | ICD-10-CM | POA: Insufficient documentation

## 2013-11-05 DIAGNOSIS — R6883 Chills (without fever): Secondary | ICD-10-CM | POA: Insufficient documentation

## 2013-11-05 LAB — CBC WITH DIFFERENTIAL/PLATELET
BASOS ABS: 0.1 10*3/uL (ref 0.0–0.1)
BASOS PCT: 1 % (ref 0–1)
EOS ABS: 0.1 10*3/uL (ref 0.0–0.7)
EOS PCT: 2 % (ref 0–5)
HEMATOCRIT: 47.3 % (ref 39.0–52.0)
Hemoglobin: 16.4 g/dL (ref 13.0–17.0)
LYMPHS PCT: 21 % (ref 12–46)
Lymphs Abs: 1.5 10*3/uL (ref 0.7–4.0)
MCH: 30.9 pg (ref 26.0–34.0)
MCHC: 34.7 g/dL (ref 30.0–36.0)
MCV: 89.2 fL (ref 78.0–100.0)
MONO ABS: 0.6 10*3/uL (ref 0.1–1.0)
Monocytes Relative: 8 % (ref 3–12)
Neutro Abs: 5 10*3/uL (ref 1.7–7.7)
Neutrophils Relative %: 68 % (ref 43–77)
PLATELETS: 245 10*3/uL (ref 150–400)
RBC: 5.3 MIL/uL (ref 4.22–5.81)
RDW: 13.6 % (ref 11.5–15.5)
WBC: 7.3 10*3/uL (ref 4.0–10.5)

## 2013-11-05 LAB — COMPREHENSIVE METABOLIC PANEL
ALBUMIN: 4.2 g/dL (ref 3.5–5.2)
ALT: 15 U/L (ref 0–53)
AST: 20 U/L (ref 0–37)
Alkaline Phosphatase: 67 U/L (ref 39–117)
BUN: 8 mg/dL (ref 6–23)
CALCIUM: 9.5 mg/dL (ref 8.4–10.5)
CO2: 26 meq/L (ref 19–32)
CREATININE: 0.96 mg/dL (ref 0.50–1.35)
Chloride: 100 mEq/L (ref 96–112)
GFR calc Af Amer: >90 mL/min (ref >90)
Glucose, Bld: 95 mg/dL (ref 70–99)
Potassium: 4.6 mEq/L (ref 3.7–5.3)
SODIUM: 139 meq/L (ref 137–147)
TOTAL PROTEIN: 7.1 g/dL (ref 6.0–8.3)
Total Bilirubin: 0.7 mg/dL (ref 0.3–1.2)

## 2013-11-05 LAB — URINALYSIS, ROUTINE W REFLEX MICROSCOPIC
BILIRUBIN URINE: NEGATIVE
GLUCOSE, UA: NEGATIVE mg/dL
HGB URINE DIPSTICK: NEGATIVE
Ketones, ur: NEGATIVE mg/dL
Leukocytes, UA: NEGATIVE
Nitrite: NEGATIVE
PH: 7 (ref 5.0–8.0)
Protein, ur: NEGATIVE mg/dL
SPECIFIC GRAVITY, URINE: 1 — AB (ref 1.005–1.030)
UROBILINOGEN UA: 0.2 mg/dL (ref 0.0–1.0)

## 2013-11-05 MED ORDER — KETOROLAC TROMETHAMINE 15 MG/ML IJ SOLN
15.0000 mg | Freq: Once | INTRAMUSCULAR | Status: AC
  Administered 2013-11-05: 15 mg via INTRAVENOUS
  Filled 2013-11-05: qty 1

## 2013-11-05 MED ORDER — SODIUM CHLORIDE 0.9 % IV BOLUS (SEPSIS)
1000.0000 mL | Freq: Once | INTRAVENOUS | Status: AC
  Administered 2013-11-05: 1000 mL via INTRAVENOUS

## 2013-11-05 NOTE — ED Notes (Signed)
Per pt, woke up this morning with a headache.  States urine is dark.  Works [employer].  Travels with truck door open.  Also states generalized body aches.

## 2013-11-05 NOTE — Discharge Instructions (Signed)
Call for a follow up appointment with a Family or Primary Care Provider.  Return if Symptoms worsen.   Take medication as prescribed.  Continues to drink plenty of fluids and eat a well balanced diet. Continue to use cool towels to cool yourself off during the hot days.

## 2013-11-05 NOTE — ED Provider Notes (Signed)
CSN: [id number]     Arrival date & time 11/05/13  1011 History   First MD Initiated Contact with Patient 11/05/13 1115     Chief Complaint  Patient presents with  . Headache  . Generalized Body Aches     (Consider location/radiation/quality/duration/timing/severity/associated sxs/prior Treatment) HPI Comments: The patient is a 48 year old male past medical history of anxiety, chronic back pain presents emergency room chief complaint of right sided headache since this morning. The patient reports he awoke with a right-sided headache.  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 He also reports he had a brief episode of blurry vision lasting less than 5 minutes, resolved after placing a cold towel on his head.  He reports chills, denies fever or. Denies nausea or vomiting.  The patient reports confusion, stating upon his alarm going off he turned it off and told his wife that he did not have to go to work today.  The patient reports drinking approximately 1 gallon of water with a Gatorade each day while working, and eating a peanut butter and jelly and some crackers. The patient reports eating cantaloupe this morning prior to going to work. He reports drinking approximately 64 ounces of water prior to arrival.  Denies taking any medication for his headache prior to arrival.  The patient reports a history of cluster headaches. PCP: Ezequiel Kayser, MD  The history is provided by the patient. No language interpreter was used.    Past Medical History  Diagnosis Date  . Prostate enlargement   . Anxiety     takes valium  . Carpal tunnel syndrome   . Chronic back pain    Past Surgical History  Procedure Laterality Date  . Back surgery  2002    Lumbar fusion l4-5  . Lumbar fusion  2002  . Spinal cord stimulator insertion  2008  . Carpal tunnel release  2012     left elbow and wrist  . Spinal cord stimulator insertion  04/01/2011    Procedure: LUMBAR SPINAL CORD STIMULATOR INSERTION;  Surgeon: Dorian Heckle, MD;  Location: [facility];  Service: Neurosurgery;  Laterality: N/A;  REVISION OF SPINAL CORD STIMULATOR WITH INTERNAL PULSE GENERATOR  . I&d extremity  11/08/2011    Procedure: IRRIGATION AND DEBRIDEMENT EXTREMITY;  Surgeon: Wyn Forster., MD;  Location: [facility];  Service: Orthopedics;  Laterality: Left;  I&D left hand   History reviewed. No pertinent family history. History  Substance Use Topics  . Smoking status: Former Smoker    Quit date: 03/24/1994  . Smokeless tobacco: Current User    Types: Snuff  . Alcohol Use: No    Review of Systems  Constitutional: Positive for chills and diaphoresis. Negative for fever.  Eyes: Positive for visual disturbance. Negative for photophobia.  Respiratory: Negative for shortness of breath.   Cardiovascular: Negative for chest pain.  Gastrointestinal: Negative for nausea, vomiting, abdominal pain, diarrhea and constipation.  Musculoskeletal: Positive for back pain and myalgias. Negative for joint swelling and neck pain.  Skin: Negative for color change and wound.      Allergies  Morphine and related; Nabumetone; and Penicillins  Home Medications   Prior to Admission medications   Medication Sig Start Date End Date Taking? Authorizing Ivania Teagarden  acetaminophen (TYLENOL) 500 MG tablet Take 500 mg by mouth daily as needed for pain.  Historical Graves Nipp, MD  amitriptyline (ELAVIL) 25 MG tablet Take 25 mg by mouth at bedtime.    Historical Haliyah Fryman, MD  cholecalciferol (VITAMIN D) 1000 UNITS tablet Take 1,000 Units by mouth 2 (two) times daily.    Historical Azzan Butler, MD  diazepam (VALIUM) 10 MG tablet Take 10 mg by mouth 3 (three) times daily as needed for anxiety (for muscle spasms).     Historical Suella Cogar, MD  ibuprofen (ADVIL,MOTRIN) 200 MG tablet Take 400 mg by  mouth daily as needed for pain.     Historical Harland Aguiniga, MD  lidocaine (LIDODERM) 5 % Place 2-3 patches onto the skin daily. Remove & Discard patch within 12 hours or as directed by MD    Historical Corliss Lamartina, MD  lisdexamfetamine (VYVANSE) 30 MG capsule Take 15 mg by mouth daily. Breaks capsules and mixes powder in water    Historical Carlyann Placide, MD  loratadine (CLARITIN) 10 MG tablet Take 10 mg by mouth daily as needed for allergies.    Historical Dynesha Woolen, MD  Multiple Vitamin (MULTIVITAMIN WITH MINERALS) TABS Take 1 tablet by mouth daily.    Historical Pattrick Bady, MD  oxymorphone (OPANA ER) 15 MG 12 hr tablet Take 15 mg by mouth every 12 (twelve) hours.    Historical Athalene Kolle, MD  oxymorphone (OPANA) 10 MG tablet Take 10 mg by mouth 3 (three) times daily.    Historical Solene Hereford, MD  testosterone cypionate (DEPOTESTOTERONE CYPIONATE) 200 MG/ML injection Inject 400 mg into the muscle every 21 ( twenty-one) days. Verified strength with pharmacy (last dose 08/13/12)    Historical Alfonzo Arca, MD   BP 133/94  Pulse 78  Temp(Src) 97.7 F (36.5 C) (Oral)  Resp 16  SpO2 98% Physical Exam  Nursing note and vitals reviewed. Constitutional: He is oriented to person, place, and time. He appears well-developed and well-nourished.  Non-toxic appearance. He does not have a sickly appearance. He does not appear ill. No distress.  Appears uncomfortable.  HENT:  Head: Normocephalic and atraumatic.  Eyes: EOM are normal. Pupils are equal, round, and reactive to light. Right eye exhibits no discharge. Left eye exhibits no discharge. No scleral icterus.  Neck: Normal range of motion. Neck supple.  Cardiovascular: Normal rate and regular rhythm.   No murmur heard. No lower extremity edema  Pulmonary/Chest: Effort normal and breath sounds normal. He has no wheezes. He has no rales. He exhibits no tenderness.  Abdominal: Soft. Bowel sounds are normal. He exhibits no distension. There is no tenderness. There is no  rebound and no guarding.  Musculoskeletal: Normal range of motion. He exhibits no edema.  No tenderness to palpation of lumbar spine and paravertebral muscles.  Neurological: He is alert and oriented to person, place, and time. He is not disoriented. No cranial nerve deficit or sensory deficit. GCS eye subscore is 4. GCS verbal subscore is 5. GCS motor subscore is 6.  Speech is clear and goal oriented, follows commands Cranial nerves III - XII grossly intact, no facial droop Normal strength in upper and lower extremities bilaterally, strong and equal grip strength Sensation normal to light touch Moves all 4 extremities without ataxia, coordination intact   Skin: Skin is warm and dry. No rash noted. He is not diaphoretic.  Psychiatric: He has a normal mood and affect. His behavior is normal. Thought content normal.    ED Course  Procedures (including critical care time) Labs Review Results for orders placed during the hospital encounter of 11/05/13  CBC WITH DIFFERENTIAL      Result  Value Ref Range   WBC 7.3  4.0 - 10.5 K/uL   RBC 5.30  4.22 - 5.81 MIL/uL   Hemoglobin 16.4  13.0 - 17.0 g/dL   HCT 24.447.3  01.039.0 - 27.252.0 %   MCV 89.2  78.0 - 100.0 fL   MCH 30.9  26.0 - 34.0 pg   MCHC 34.7  30.0 - 36.0 g/dL   RDW 53.613.6  64.411.5 - 03.415.5 %   Platelets 245  150 - 400 K/uL   Neutrophils Relative % 68  43 - 77 %   Neutro Abs 5.0  1.7 - 7.7 K/uL   Lymphocytes Relative 21  12 - 46 %   Lymphs Abs 1.5  0.7 - 4.0 K/uL   Monocytes Relative 8  3 - 12 %   Monocytes Absolute 0.6  0.1 - 1.0 K/uL   Eosinophils Relative 2  0 - 5 %   Eosinophils Absolute 0.1  0.0 - 0.7 K/uL   Basophils Relative 1  0 - 1 %   Basophils Absolute 0.1  0.0 - 0.1 K/uL  COMPREHENSIVE METABOLIC PANEL      Result Value Ref Range   Sodium 139  137 - 147 mEq/L   Potassium 4.6  3.7 - 5.3 mEq/L   Chloride 100  96 - 112 mEq/L   CO2 26  19 - 32 mEq/L   Glucose, Bld 95  70 - 99 mg/dL   BUN 8  6 - 23 mg/dL   Creatinine, Ser 7.420.96  0.50  - 1.35 mg/dL   Calcium 9.5  8.4 - 59.510.5 mg/dL   Total Protein 7.1  6.0 - 8.3 g/dL   Albumin 4.2  3.5 - 5.2 g/dL   AST 20  0 - 37 U/L   ALT 15  0 - 53 U/L   Alkaline Phosphatase 67  39 - 117 U/L   Total Bilirubin 0.7  0.3 - 1.2 mg/dL   GFR calc non Af Amer >90  >90 mL/min   GFR calc Af Amer >90  >90 mL/min  URINALYSIS, ROUTINE W REFLEX MICROSCOPIC      Result Value Ref Range   Color, Urine YELLOW  YELLOW   APPearance CLEAR  CLEAR   Specific Gravity, Urine 1.000 (*) 1.005 - 1.030   pH 7.0  5.0 - 8.0   Glucose, UA NEGATIVE  NEGATIVE mg/dL   Hgb urine dipstick NEGATIVE  NEGATIVE   Bilirubin Urine NEGATIVE  NEGATIVE   Ketones, ur NEGATIVE  NEGATIVE mg/dL   Protein, ur NEGATIVE  NEGATIVE mg/dL   Urobilinogen, UA 0.2  0.0 - 1.0 mg/dL   Nitrite NEGATIVE  NEGATIVE   Leukocytes, UA NEGATIVE  NEGATIVE   No results found.   Imaging Review No results found.   EKG Interpretation None      MDM   Final diagnoses:  Right-sided headache  Heat exposure, initial encounter   Patient reports multiple symptoms headache, cramping, likely do to dehydration and decreased oral intake over the past several days. Plan to treat symptomatically for headache and hydration. CBC and CMP unremarkable. UA shows diluted urine. 1320 reevaluation patient resting comfortably in room, states headache and symptom improvement after medication. Discussed lab results. Encouraged patient to drink plenty fluids, eat throughout the day while working.  Discussed lab results, and treatment plan with the patient. Return precautions given. Reports understanding and no other concerns at this time.  Patient is stable for discharge at this time.  Meds given in ED:  Medications  sodium  chloride 0.9 % bolus 1,000 mL (0 mLs Intravenous Stopped 11/05/13 1330)  ketorolac (TORADOL) 15 MG/ML injection 15 mg (15 mg Intravenous Given 11/05/13 1230)    Discharge Medication List as of 11/05/2013  1:57 PM          Leotis ShamesLauren Doretha ImusM  Parker, PA-C 11/06/13 1243

## 2013-11-07 NOTE — ED Provider Notes (Signed)
Medical screening examination/treatment/procedure(s) were performed by non-physician practitioner and as supervising physician I was immediately available for consultation/collaboration.  Elliott L Wentz, MD 11/07/13 2230 

## 2014-01-08 ENCOUNTER — Emergency Department (HOSPITAL_COMMUNITY)
Admission: EM | Admit: 2014-01-08 | Discharge: 2014-01-08 | Disposition: A | Discharge status: Home / Self Care | Payer: Worker's Compensation | Attending: Family Medicine | Admitting: Family Medicine

## 2014-01-08 ENCOUNTER — Encounter (HOSPITAL_COMMUNITY): Payer: Self-pay | Admitting: Emergency Medicine

## 2014-01-08 ENCOUNTER — Emergency Department (HOSPITAL_COMMUNITY): Payer: Worker's Compensation

## 2014-01-08 DIAGNOSIS — S56429A Laceration of extensor muscle, fascia and tendon of unspecified finger at forearm level, initial encounter: Secondary | ICD-10-CM

## 2014-01-08 DIAGNOSIS — Z87891 Personal history of nicotine dependence: Secondary | ICD-10-CM | POA: Diagnosis not present

## 2014-01-08 DIAGNOSIS — W268XXA Contact with other sharp object(s), not elsewhere classified, initial encounter: Secondary | ICD-10-CM | POA: Insufficient documentation

## 2014-01-08 DIAGNOSIS — S61412A Laceration without foreign body of left hand, initial encounter: Secondary | ICD-10-CM

## 2014-01-08 DIAGNOSIS — Y9289 Other specified places as the place of occurrence of the external cause: Secondary | ICD-10-CM | POA: Insufficient documentation

## 2014-01-08 DIAGNOSIS — Z8669 Personal history of other diseases of the nervous system and sense organs: Secondary | ICD-10-CM | POA: Insufficient documentation

## 2014-01-08 DIAGNOSIS — Y990 Civilian activity done for income or pay: Secondary | ICD-10-CM | POA: Insufficient documentation

## 2014-01-08 DIAGNOSIS — Z79899 Other long term (current) drug therapy: Secondary | ICD-10-CM | POA: Insufficient documentation

## 2014-01-08 DIAGNOSIS — S61409A Unspecified open wound of unspecified hand, initial encounter: Secondary | ICD-10-CM | POA: Diagnosis not present

## 2014-01-08 DIAGNOSIS — N400 Enlarged prostate without lower urinary tract symptoms: Secondary | ICD-10-CM | POA: Diagnosis not present

## 2014-01-08 DIAGNOSIS — G8929 Other chronic pain: Secondary | ICD-10-CM | POA: Insufficient documentation

## 2014-01-08 DIAGNOSIS — S61209A Unspecified open wound of unspecified finger without damage to nail, initial encounter: Secondary | ICD-10-CM | POA: Diagnosis present

## 2014-01-08 DIAGNOSIS — Y9389 Activity, other specified: Secondary | ICD-10-CM | POA: Insufficient documentation

## 2014-01-08 DIAGNOSIS — F411 Generalized anxiety disorder: Secondary | ICD-10-CM | POA: Insufficient documentation

## 2014-01-08 MED ORDER — DOXYCYCLINE HYCLATE 100 MG PO TABS: 100.0000 mg | ORAL_TABLET | Freq: Two times a day (BID) | ORAL | Status: DC

## 2014-01-08 NOTE — ED Notes (Signed)
Pt has left hand middle finger laceration. Tendon can be reportedly seen; full movement and sensation intact. Dressed from UC; bleeding controlled.

## 2014-01-08 NOTE — ED Provider Notes (Signed)
CSN: [id number]     Arrival date & time 01/08/14  1206 History   First MD Initiated Contact with Patient 01/08/14 1222     Chief Complaint  Patient presents with  . Extremity Laceration     (Consider location/radiation/quality/duration/timing/severity/associated sxs/prior Treatment) HPI  L hand laceration. Occurred around 10am today. 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 Other trucks apparently are not sharp in this area. Last tetanus 2 yrs ago. Started bleedin immediately which improved and stopped w/ pressure. Able to move fingers and sensation intact.    Past Medical History  Diagnosis Date  . Prostate enlargement   . Anxiety     takes valium  . Carpal tunnel syndrome   . Chronic back pain    Past Surgical History  Procedure Laterality Date  . Back surgery  2002    Lumbar fusion l4-5  . Lumbar fusion  2002  . Spinal cord stimulator insertion  2008  . Carpal tunnel release  2012    left elbow and wrist  . Spinal cord stimulator insertion  04/01/2011    Procedure: LUMBAR SPINAL CORD STIMULATOR INSERTION;  Surgeon: Dorian Heckle, MD;  Location: [facility];  Service: Neurosurgery;  Laterality: N/A;  REVISION OF SPINAL CORD STIMULATOR WITH INTERNAL PULSE GENERATOR  . I&d extremity  11/08/2011    Procedure: IRRIGATION AND DEBRIDEMENT EXTREMITY;  Surgeon: Wyn Forster., MD;  Location: [facility];  Service: Orthopedics;  Laterality: Left;  I&D left hand   History reviewed. No pertinent family history. History  Substance Use Topics  . Smoking status: Former Smoker    Quit date: 03/24/1994  . Smokeless tobacco: Current User    Types: Snuff  . Alcohol Use: No    Review of Systems  Constitutional: Positive for activity change.  Respiratory: Negative for shortness of breath.   Cardiovascular: Negative for chest pain.      Allergies  Morphine  and related; Nabumetone; and Penicillins  Home Medications   Prior to Admission medications   Medication Sig Start Date End Date Taking? Authorizing Provider  amitriptyline (ELAVIL) 25 MG tablet Take 25 mg by mouth at bedtime.    Historical Provider, MD  cholecalciferol (VITAMIN D) 1000 UNITS tablet Take 1,000 Units by mouth 2 (two) times daily.    Historical Provider, MD  diazepam (VALIUM) 10 MG tablet Take 10 mg by mouth 3 (three) times daily as needed (muscle spasms or anxiety).     Historical Provider, MD  HYDROmorphone (DILAUDID) 4 MG tablet Take 4 mg by mouth every 4 (four) hours as needed for severe pain.    Historical Provider, MD  HYDROmorphone HCl (EXALGO) 16 MG T24A Take 16 mg by mouth 2 (two) times daily.    Historical Provider, MD  lidocaine (LIDODERM) 5 % Place 2-3 patches onto the skin daily as needed (pain). Remove & Discard patch within 12 hours or as directed by MD    Historical Provider, MD  lisdexamfetamine (VYVANSE) 30 MG capsule Take 15 mg by mouth daily as needed (patient preference). Breaks capsules and mixes powder in water    Historical Provider, MD  loratadine (CLARITIN) 10 MG tablet Take 10 mg by mouth daily as needed for allergies.    Historical Provider, MD  MAGNESIUM-ZINC PO Take 1 tablet by mouth every morning.    Historical Provider, MD  Multiple Vitamin (MULTIVITAMIN WITH MINERALS) TABS Take 1 tablet by mouth every morning.     Historical Provider, MD  Probiotic Product (PROBIOTIC DAILY PO) Take 1 capsule by mouth every morning.    Historical Provider, MD  testosterone cypionate (DEPOTESTOTERONE CYPIONATE) 200 MG/ML injection Inject 400 mg into the muscle every 21 ( twenty-one) days. Every 3rd Sunday.    Historical Provider, MD   BP 143/94  Pulse 78  Temp(Src) 97.9 F (36.6 C) (Oral)  Resp 18  SpO2 98% Physical Exam  Constitutional: He appears well-developed and well-nourished. No distress.  Eyes: EOM are normal. Pupils are equal, round, and reactive to  light.  Neck: Normal range of motion.  Cardiovascular: Normal rate.   Pulmonary/Chest: Effort normal. No respiratory distress.  Abdominal: Soft.  Musculoskeletal:  L 3rd digit w/ large circumfrential laceration w/  parallel through and through laceration to the extensor tendon. Finger w/ complete flexion and extension.   Neurological: He is alert.  Skin: Skin is warm. He is not diaphoretic.  Psychiatric: He has a normal mood and affect. His behavior is normal. Judgment and thought content normal.    ED Course  LACERATION REPAIR Date/Time: 01/08/2014 4:52 PM Performed by: Konrad Dolores, Abbygail Willhoite J Authorized by: Konrad Dolores, Keawe Marcello J Consent: Verbal consent obtained. Risks and benefits: risks, benefits and alternatives were discussed Consent given by: patient and spouse Patient identity confirmed: verbally with patient Body area: upper extremity Location details: left hand Laceration length: 4.7 cm Contamination: The wound is contaminated. Foreign bodies: no foreign bodies Tendon involvement: complex (through and through 1.5 oblique laceration to the center of the tendon) Nerve involvement: none Vascular damage: yes Local anesthetic: lidocaine 2% without epinephrine Anesthetic total: 6 ml Patient sedated: no Irrigation solution: saline Amount of cleaning: extensive Debridement: none Degree of undermining: none Wound skin closure material used: 3-0 silk, 9 simple interrupted. Subcutaneous closure: 4-0 Vicryl (4 simple interrupted) Wound tendon closure material used: 4-0 Vicryl 1 figure of 8 sutrue proximally and one simple interupted. Number of sutures: 15 Approximation: close Approximation difficulty: complex Dressing: splint and antibiotic ointment Comments: Procedure complicated by bleeding of superficial vein and artery. Hemostasis obtained w/ 2 sutures through the severed vessel and pressure. bood flow to tissue was excellent. Cap refill of skin overlying injury and fingertip <2 sec.     (including critical care time) Labs Review Labs Reviewed - No data to display  Imaging Review No results found.   EKG Interpretation None      MDM   Final diagnoses:  Hand laceration, left, initial encounter  Extensor tendon laceration of finger with open wound, initial encounter    Tetanus UTD DIscussed case w/ on Call hand specialist Dr. Melvyn Novas and feels the hand is ok for repair and then outpt f/u Repair as above Pt to f/u w/ Dr. Melvyn Novas w/in the next week Pt placed in custom splin w/ wrist in 30 degrees extension and MCP in 30 degrees flexion w/ PIP and DIp straight.  Detailed instructions give Precautions given and all questions answered Start Doxy as prophylaxis  Shelly Flatten, MD Family Medicine 01/08/2014, 4:52 PM      Ozella Rocks, MD 01/08/14 [phone]

## 2014-01-08 NOTE — Discharge Instructions (Signed)
You severely cut your finger and the tendon THis was repaired w/ a total of 15 sutures Please call Dr Glenna Durand office to follow up within the next 7 days Do not take your hand out of the splint until that time Apply antibiotic ointment as needed to ensure the area stays moist.  Start your antibiotics Start on a high dose antiinflammatory and take it around the clock as tolerated.  Ice yoru hand today and tomorrow several times for 30 min at a time.

## 2014-01-08 NOTE — Progress Notes (Signed)
Orthopedic Tech Progress Note Patient Details:  Spencer Castro 03-Mar-1966 [id number]  Ortho Devices Type of Ortho Device: Ace wrap;Volar splint Ortho Device/Splint Location: LUE Ortho Device/Splint Interventions: Ordered;Application   Jennye Moccasin 01/08/2014, 5:01 PM

## 2014-01-08 NOTE — ED Notes (Signed)
Dr. Konrad Dolores in to digital block. \

## 2014-04-02 DIAGNOSIS — M5416 Radiculopathy, lumbar region: Secondary | ICD-10-CM | POA: Insufficient documentation

## 2014-06-27 ENCOUNTER — Emergency Department (HOSPITAL_COMMUNITY)
Admission: EM | Admit: 2014-06-27 | Discharge: 2014-06-28 | Disposition: A | Discharge status: Home / Self Care | Payer: BLUE CROSS/BLUE SHIELD | Attending: Emergency Medicine | Admitting: Emergency Medicine

## 2014-06-27 ENCOUNTER — Encounter (HOSPITAL_COMMUNITY): Payer: Self-pay | Admitting: Emergency Medicine

## 2014-06-27 DIAGNOSIS — Z792 Long term (current) use of antibiotics: Secondary | ICD-10-CM | POA: Insufficient documentation

## 2014-06-27 DIAGNOSIS — Z79899 Other long term (current) drug therapy: Secondary | ICD-10-CM | POA: Diagnosis not present

## 2014-06-27 DIAGNOSIS — Z87891 Personal history of nicotine dependence: Secondary | ICD-10-CM | POA: Diagnosis not present

## 2014-06-27 DIAGNOSIS — F419 Anxiety disorder, unspecified: Secondary | ICD-10-CM | POA: Diagnosis not present

## 2014-06-27 DIAGNOSIS — I10 Essential (primary) hypertension: Secondary | ICD-10-CM | POA: Insufficient documentation

## 2014-06-27 DIAGNOSIS — Z880 Allergy status to penicillin: Secondary | ICD-10-CM | POA: Diagnosis not present

## 2014-06-27 DIAGNOSIS — G8929 Other chronic pain: Secondary | ICD-10-CM | POA: Diagnosis not present

## 2014-06-27 DIAGNOSIS — R51 Headache: Secondary | ICD-10-CM

## 2014-06-27 DIAGNOSIS — Z8669 Personal history of other diseases of the nervous system and sense organs: Secondary | ICD-10-CM | POA: Diagnosis not present

## 2014-06-27 DIAGNOSIS — Z87448 Personal history of other diseases of urinary system: Secondary | ICD-10-CM | POA: Insufficient documentation

## 2014-06-27 DIAGNOSIS — R519 Headache, unspecified: Secondary | ICD-10-CM

## 2014-06-27 HISTORY — DX: Essential (primary) hypertension: I10

## 2014-06-27 NOTE — ED Notes (Signed)
Pt c/o HA and HTN, pt was seen by PCP yesterday, BP remains high today with HA onset 1300. PCP called in triamterene/hctz, pt took x 1 with no relief.

## 2014-06-28 ENCOUNTER — Emergency Department (HOSPITAL_COMMUNITY): Payer: BLUE CROSS/BLUE SHIELD

## 2014-06-28 LAB — URINALYSIS, ROUTINE W REFLEX MICROSCOPIC
Bilirubin Urine: NEGATIVE
Glucose, UA: NEGATIVE mg/dL
Hgb urine dipstick: NEGATIVE
Ketones, ur: NEGATIVE mg/dL
LEUKOCYTES UA: NEGATIVE
NITRITE: NEGATIVE
PH: 7.5 (ref 5.0–8.0)
Protein, ur: NEGATIVE mg/dL
Specific Gravity, Urine: 1.007 (ref 1.005–1.030)
Urobilinogen, UA: 0.2 mg/dL (ref 0.0–1.0)

## 2014-06-28 LAB — CBC WITH DIFFERENTIAL/PLATELET
Basophils Absolute: 0.1 10*3/uL (ref 0.0–0.1)
Basophils Relative: 1 % (ref 0–1)
Eosinophils Absolute: 0.2 10*3/uL (ref 0.0–0.7)
Eosinophils Relative: 3 % (ref 0–5)
HCT: 48.7 % (ref 39.0–52.0)
Hemoglobin: 16.3 g/dL (ref 13.0–17.0)
Lymphocytes Relative: 36 % (ref 12–46)
Lymphs Abs: 2.4 10*3/uL (ref 0.7–4.0)
MCH: 29 pg (ref 26.0–34.0)
MCHC: 33.5 g/dL (ref 30.0–36.0)
MCV: 86.7 fL (ref 78.0–100.0)
Monocytes Absolute: 0.7 10*3/uL (ref 0.1–1.0)
Monocytes Relative: 10 % (ref 3–12)
NEUTROS PCT: 50 % (ref 43–77)
Neutro Abs: 3.4 10*3/uL (ref 1.7–7.7)
PLATELETS: 361 10*3/uL (ref 150–400)
RBC: 5.62 MIL/uL (ref 4.22–5.81)
RDW: 14.1 % (ref 11.5–15.5)
WBC: 6.7 10*3/uL (ref 4.0–10.5)

## 2014-06-28 LAB — I-STAT CHEM 8, ED
BUN: 17 mg/dL (ref 6–23)
CALCIUM ION: 1.14 mmol/L (ref 1.12–1.23)
Chloride: 98 mmol/L (ref 96–112)
Creatinine, Ser: 1 mg/dL (ref 0.50–1.35)
Glucose, Bld: 99 mg/dL (ref 70–99)
HCT: 52 % (ref 39.0–52.0)
Hemoglobin: 17.7 g/dL — ABNORMAL HIGH (ref 13.0–17.0)
POTASSIUM: 3.9 mmol/L (ref 3.5–5.1)
Sodium: 137 mmol/L (ref 135–145)
TCO2: 26 mmol/L (ref 0–100)

## 2014-06-28 MED ORDER — FENTANYL CITRATE 0.05 MG/ML IJ SOLN
50.0000 ug | Freq: Once | INTRAMUSCULAR | Status: AC
  Administered 2014-06-28: 50 ug via INTRAVENOUS
  Filled 2014-06-28: qty 2

## 2014-06-28 MED ORDER — METOCLOPRAMIDE HCL 5 MG/ML IJ SOLN
5.0000 mg | Freq: Once | INTRAMUSCULAR | Status: AC
  Administered 2014-06-28: 5 mg via INTRAVENOUS
  Filled 2014-06-28: qty 2

## 2014-06-28 MED ORDER — DIPHENHYDRAMINE HCL 50 MG/ML IJ SOLN
12.5000 mg | Freq: Once | INTRAMUSCULAR | Status: AC
  Administered 2014-06-28: 12.5 mg via INTRAVENOUS
  Filled 2014-06-28: qty 1

## 2014-06-28 NOTE — ED Provider Notes (Signed)
CSN: [id number]     Arrival date & time 06/27/14  2347 History   First MD Initiated Contact with Patient 06/28/14 0024     Chief Complaint  Patient presents with  . Hypertension     (Consider location/radiation/quality/duration/timing/severity/associated sxs/prior Treatment) HPI Comments: Patient is followed by Dr. Guillermina CityMark Parini he's been noted to have hypertension that they've been keeping iron for the past year for the past several days.  He's had frontal headache and worsening blood pressure.  He spoke with Dr. Britt BologneseParini tonight, who called in prescription for triamterene/hctz take 1 dose with little relief of his symptoms was told that if he had no relief to come to the emergency department for further evaluation.  Patient is a 49 y.o. male presenting with hypertension. The history is provided by the patient.  Hypertension This is a new problem. The current episode started yesterday. The problem occurs constantly. The problem has been gradually improving. Associated symptoms include headaches. Pertinent negatives include no abdominal pain, fever, joint swelling, numbness, urinary symptoms, vertigo or visual change. Nothing aggravates the symptoms. He has tried oral narcotics (Excedrin) for the symptoms. The treatment provided no relief.    Past Medical History  Diagnosis Date  . Prostate enlargement   . Anxiety     takes valium  . Carpal tunnel syndrome   . Chronic back pain   . Hypertension    Past Surgical History  Procedure Laterality Date  . Back surgery  2002    Lumbar fusion l4-5  . Lumbar fusion  2002  . Spinal cord stimulator insertion  2008  . Carpal tunnel release  2012    left elbow and wrist  . Spinal cord stimulator insertion  04/01/2011    Procedure: LUMBAR SPINAL CORD STIMULATOR INSERTION;  Surgeon: Dorian HeckleJoseph D Stern, MD;  Location: [facility];  Service: Neurosurgery;  Laterality: N/A;  REVISION OF SPINAL CORD STIMULATOR WITH INTERNAL PULSE GENERATOR  . I&d extremity   11/08/2011    Procedure: IRRIGATION AND DEBRIDEMENT EXTREMITY;  Surgeon: Wyn Forsterobert V Sypher Jr., MD;  Location: [facility];  Service: Orthopedics;  Laterality: Left;  I&D left hand   No family history on file. History  Substance Use Topics  . Smoking status: Former Smoker    Quit date: 03/24/1994  . Smokeless tobacco: Current User    Types: Snuff  . Alcohol Use: No    Review of Systems  Constitutional: Negative for fever.  Gastrointestinal: Negative for abdominal pain.  Musculoskeletal: Negative for joint swelling.  Neurological: Positive for headaches. Negative for vertigo and numbness.      Allergies  Morphine and related; Nabumetone; and Penicillins  Home Medications   Prior to Admission medications   Medication Sig Start Date End Date Taking? Authorizing Provider  amitriptyline (ELAVIL) 25 MG tablet Take 25 mg by mouth at bedtime.   Yes Historical Provider, MD  cholecalciferol (VITAMIN D) 1000 UNITS tablet Take 1,000 Units by mouth 2 (two) times daily.   Yes Historical Provider, MD  diazepam (VALIUM) 10 MG tablet Take 10 mg by mouth 3 (three) times daily as needed (muscle spasms or anxiety).    Yes Historical Provider, MD  HYDROmorphone HCl (EXALGO) 16 MG T24A Take 16 mg by mouth 2 (two) times daily.   Yes Historical Provider, MD  lidocaine (LIDODERM) 5 % Place 2-3 patches onto the skin daily as needed (pain). Remove & Discard patch within 12 hours or as directed by MD   Yes Historical Provider, MD  loratadine (CLARITIN) 10 MG tablet Take 10 mg by mouth daily as needed for allergies.   Yes Historical Provider, MD  MAGNESIUM-ZINC PO Take 1 tablet by mouth every morning.   Yes Historical Provider, MD  Multiple Vitamin (MULTIVITAMIN WITH MINERALS) TABS Take 1 tablet by mouth every morning.    Yes Historical Provider, MD  oxymorphone (OPANA) 10 MG tablet Take 10 mg by mouth 4 (four) times daily.   Yes Historical Provider, MD  Probiotic Product (PROBIOTIC DAILY PO)  Take 1 capsule by mouth every morning.   Yes Historical Provider, MD  cyclobenzaprine (FLEXERIL) 10 MG tablet Take 10 mg by mouth 3 (three) times daily as needed for muscle spasms.    Historical Provider, MD  doxycycline (VIBRA-TABS) 100 MG tablet Take 1 tablet (100 mg total) by mouth 2 (two) times daily. Patient not taking: Reported on 06/27/2014 01/08/14   Ozella Rocks, MD  lisdexamfetamine (VYVANSE) 30 MG capsule Take 15 mg by mouth daily as needed (patient preference). Breaks capsules and mixes powder in water    Historical Provider, MD  testosterone cypionate (DEPOTESTOTERONE CYPIONATE) 200 MG/ML injection Inject 400 mg into the muscle every 21 ( twenty-one) days. Every 3rd Sunday.    Historical Provider, MD   BP 134/88 mmHg  Pulse 73  Temp(Src) 98 F (36.7 C) (Oral)  Resp 15  SpO2 96% Physical Exam  ED Course  Procedures (including critical care time) Labs Review Labs Reviewed  I-STAT CHEM 8, ED - Abnormal; Notable for the following:    Hemoglobin 17.7 (*)    All other components within normal limits  CBC WITH DIFFERENTIAL/PLATELET  URINALYSIS, ROUTINE W REFLEX MICROSCOPIC    Imaging Review Ct Head Wo Contrast  06/28/2014   CLINICAL DATA:  49 year old male with headache. Hypertension. Initial encounter.  EXAM: CT HEAD WITHOUT CONTRAST  TECHNIQUE: Contiguous axial images were obtained from the base of the skull through the vertex without intravenous contrast.  COMPARISON:  11/29/10  FINDINGS: Visualized paranasal sinuses and mastoids are clear. Visualized orbits and scalp soft tissues are within normal limits. No acute osseous abnormality identified. No midline shift, ventriculomegaly, mass effect, evidence of mass lesion, intracranial hemorrhage or evidence of cortically based acute infarction. Gray-white matter differentiation is within normal limits throughout the brain.  IMPRESSION: Normal noncontrast CT appearance of the brain.   Electronically Signed   By: Odessa Fleming M.D.   On:  06/28/2014 01:25     EKG Interpretation   Date/Time:  Saturday June 28 2014 00:09:50 EST Ventricular Rate:  79 PR Interval:  146 QRS Duration: 102 QT Interval:  365 QTC Calculation: 418 R Axis:   35 Text Interpretation:  Sinus rhythm No significant change was found  Confirmed by CAMPOS  MD, KEVIN ([id number]) on 06/28/2014 1:58:27 AM     patient's head CT labs, urine are all within normal parameters.  EKG shows no sign of an organ damage.  Patient was reassured.  His blood pressure at time of discharge was 122/76 His headache is completely resolved.  He is comfortable going home at this time  MDM   Final diagnoses:  Hypertension  Nonintractable headache, unspecified chronicity pattern, unspecified headache type        Arman Filter, NP 06/28/14 [id number]  Lyanne Co, MD 06/28/14 [phone]

## 2014-06-28 NOTE — Discharge Instructions (Signed)
Make sure to take your blood pressure medicine at the same time of the day since his is a new medication.   I would suggest taking it in the evening so that if you aren't having side effects you  will sleep through the worst.  Make sure you keep your appointment with Dr. Sherrye PayorParini on Monday as scheduled

## 2018-08-08 ENCOUNTER — Other Ambulatory Visit: Payer: Self-pay | Admitting: Neurosurgery

## 2018-08-08 DIAGNOSIS — M5416 Radiculopathy, lumbar region: Secondary | ICD-10-CM

## 2018-10-02 ENCOUNTER — Telehealth: Payer: Self-pay | Admitting: Nurse Practitioner

## 2018-10-02 NOTE — Telephone Encounter (Signed)
Phone call to patient to verify medication list and allergies for myelogram procedure. Pt instructed to hold vyvanse and elavil for 48hrs prior to myelogram appointment time. Pt verbalized understanding.

## 2018-10-18 NOTE — Discharge Instructions (Signed)
Myelogram Discharge Instructions  1. Go home and rest quietly for the next 24 hours.  It is important to lie flat for the next 24 hours.  Get up only to go to the restroom.  You may lie in the bed or on a couch on your back, your stomach, your left side or your right side.  You may have one pillow under your head.  You may have pillows between your knees while you are on your side or under your knees while you are on your back.  2. DO NOT drive today.  Recline the seat as far back as it will go, while still wearing your seat belt, on the way home.  3. You may get up to go to the bathroom as needed.  You may sit up for 10 minutes to eat.  You may resume your normal diet and medications unless otherwise indicated.  Drink lots of extra fluids today and tomorrow.  4. The incidence of headache, nausea, or vomiting is about 5% (one in 20 patients).  If you develop a headache, lie flat and drink plenty of fluids until the headache goes away.  Caffeinated beverages may be helpful.  If you develop severe nausea and vomiting or a headache that does not go away with flat bed rest, call [phone].  5. You may resume normal activities after your 24 hours of bed rest is over; however, do not exert yourself strongly or do any heavy lifting tomorrow. If when you get up you have a headache when standing, go back to bed and force fluids for another 24 hours.  6. Call your physician for a follow-up appointment.  The results of your myelogram will be sent directly to your physician by the following day.  7. If you have any questions or if complications develop after you arrive home, please call [phone].  Discharge instructions have been explained to the patient.  The patient, or the person responsible for the patient, fully understands these instructions.  YOU MAY RESTART YOUR ELAVIL AND VYVANSE TOMORROW 10/20/2018 AT 10:50AM.

## 2018-10-19 ENCOUNTER — Other Ambulatory Visit: Payer: Self-pay

## 2018-10-19 ENCOUNTER — Ambulatory Visit
Admission: RE | Admit: 2018-10-19 | Discharge: 2018-10-19 | Disposition: A | Discharge status: Home / Self Care | Payer: Self-pay | Source: Ambulatory Visit | Attending: Neurosurgery | Admitting: Neurosurgery

## 2018-10-19 ENCOUNTER — Ambulatory Visit
Admission: RE | Admit: 2018-10-19 | Discharge: 2018-10-19 | Disposition: A | Discharge status: Home / Self Care | Payer: BLUE CROSS/BLUE SHIELD | Source: Ambulatory Visit | Attending: Neurosurgery | Admitting: Neurosurgery

## 2018-10-19 DIAGNOSIS — M5416 Radiculopathy, lumbar region: Secondary | ICD-10-CM

## 2018-10-19 MED ORDER — ONDANSETRON HCL 4 MG/2ML IJ SOLN: 4.0000 mg | Freq: Four times a day (QID) | INTRAMUSCULAR | Status: DC | PRN

## 2018-10-19 MED ORDER — ONDANSETRON HCL 4 MG/2ML IJ SOLN
4.0000 mg | Freq: Once | INTRAMUSCULAR | Status: AC
  Administered 2018-10-19: 4 mg via INTRAMUSCULAR

## 2018-10-19 MED ORDER — IOPAMIDOL (ISOVUE-M 200) INJECTION 41%
15.0000 mL | Freq: Once | INTRAMUSCULAR | Status: AC
  Administered 2018-10-19: 15 mL via INTRATHECAL

## 2018-10-19 MED ORDER — DIAZEPAM 5 MG PO TABS
10.0000 mg | ORAL_TABLET | Freq: Once | ORAL | Status: AC
  Administered 2018-10-19: 5 mg via ORAL

## 2018-10-19 MED ORDER — MEPERIDINE HCL 100 MG/ML IJ SOLN
25.0000 mg | Freq: Once | INTRAMUSCULAR | Status: AC
  Administered 2018-10-19: 25 mg via INTRAMUSCULAR

## 2018-10-19 NOTE — Progress Notes (Signed)
Patient states he has been off Elavil and Vyvanse for at least the past two days.

## 2019-02-13 DIAGNOSIS — M48061 Spinal stenosis, lumbar region without neurogenic claudication: Secondary | ICD-10-CM | POA: Insufficient documentation

## 2019-03-05 ENCOUNTER — Other Ambulatory Visit: Payer: Self-pay | Admitting: Neurosurgery

## 2019-03-18 ENCOUNTER — Encounter (HOSPITAL_COMMUNITY): Payer: Self-pay

## 2019-03-18 NOTE — Pre-Procedure Instructions (Signed)
[facility]  03/18/2019      [address], [place] [address] Phone: [phone] Fax: [phone]    Your procedure is scheduled on 03/22/19.  Report to [facility] Admitting at 1120 A.M.  Call this number if you have problems the morning of surgery:  [phone]   Remember:  Do not eat or drink after midnight.     Take these medicines the morning of surgery with A SIP OF WATER ----VALIUM,HYDROMORPHONE    Do not wear jewelry, make-up or nail polish.  Do not wear lotions, powders, or perfumes, or deodorant.  Do not shave 48 hours prior to surgery.  Men may shave face and neck.  Do not bring valuables to the hospital.  [facility] is not responsible for any belongings or valuables.  Contacts, dentures or bridgework may not be worn into surgery.  Leave your suitcase in the car.  After surgery it may be brought to your room.  For patients admitted to the hospital, discharge time will be determined by your treatment team.  Do not take any aspirin,anti-inflammatories,vitamins,or herbal supplements 5-7 days prior to surgery.  Special instructions:  [facility] - Preparing for Surgery  Before surgery, you can play an important role.  Because skin is not sterile, your skin needs to be as free of germs as possible.  You can reduce the number of germs on you skin by washing with CHG (chlorahexidine gluconate) soap before surgery.  CHG is an antiseptic cleaner which kills germs and bonds with the skin to continue killing germs even after washing.  Oral Hygiene is also important in reducing the risk of infection.  Remember to brush your teeth with your regular toothpaste the morning of surgery.  Please DO NOT use if you have an allergy to CHG or antibacterial soaps.  If your skin becomes reddened/irritated stop using the CHG and inform your nurse when you arrive at Short Stay.  Do not shave  (including legs and underarms) for at least 48 hours prior to the first CHG shower.  You may shave your face.  Please follow these instructions carefully:   1.  Shower with CHG Soap the night before surgery and the morning of Surgery.  2.  If you choose to wash your hair, wash your hair first as usual with your normal shampoo.  3.  After you shampoo, rinse your hair and body thoroughly to remove the shampoo. 4.  Use CHG as you would any other liquid soap.  You can apply chg directly to the skin and wash gently with a      scrungie or washcloth.           5.  Apply the CHG Soap to your body ONLY FROM THE NECK DOWN.   Do not use on open wounds or open sores. Avoid contact with your eyes, ears, mouth and genitals (private parts).  Wash genitals (private parts) with your normal soap.  6.  Wash thoroughly, paying special attention to the area where your surgery will be performed.  7.  Thoroughly rinse your body with warm water from the neck down.  8.  DO NOT shower/wash with your normal soap after using and rinsing off the CHG Soap.  9.  Pat yourself dry with a clean towel.            10.  Wear clean pajamas.  11.  Place clean sheets on your bed the night of your first shower and do not sleep with pets.  Day of Surgery  Do not apply any lotions/deoderants the morning of surgery.   Please wear clean clothes to the hospital/surgery center. Remember to brush your teeth with toothpaste.    Please read over the following fact sheets that you were given. MRSA Information

## 2019-03-19 ENCOUNTER — Encounter (HOSPITAL_COMMUNITY)
Admission: RE | Admit: 2019-03-19 | Discharge: 2019-03-19 | Disposition: A | Discharge status: Home / Self Care | Payer: No Typology Code available for payment source | Source: Ambulatory Visit | Attending: Neurosurgery | Admitting: Neurosurgery

## 2019-03-19 ENCOUNTER — Encounter (HOSPITAL_COMMUNITY): Payer: Self-pay

## 2019-03-19 ENCOUNTER — Other Ambulatory Visit: Payer: Self-pay

## 2019-03-19 ENCOUNTER — Other Ambulatory Visit (HOSPITAL_COMMUNITY)
Admission: RE | Admit: 2019-03-19 | Discharge: 2019-03-19 | Disposition: A | Discharge status: Home / Self Care | Payer: Self-pay | Source: Ambulatory Visit | Attending: Neurosurgery | Admitting: Neurosurgery

## 2019-03-19 DIAGNOSIS — Z01818 Encounter for other preprocedural examination: Secondary | ICD-10-CM | POA: Diagnosis not present

## 2019-03-19 DIAGNOSIS — Z01812 Encounter for preprocedural laboratory examination: Secondary | ICD-10-CM | POA: Insufficient documentation

## 2019-03-19 DIAGNOSIS — Z20828 Contact with and (suspected) exposure to other viral communicable diseases: Secondary | ICD-10-CM | POA: Insufficient documentation

## 2019-03-19 HISTORY — DX: Unspecified osteoarthritis, unspecified site: M19.90

## 2019-03-19 LAB — SURGICAL PCR SCREEN
MRSA, PCR: NEGATIVE
Staphylococcus aureus: NEGATIVE

## 2019-03-19 LAB — CBC
HCT: 49 % (ref 39.0–52.0)
Hemoglobin: 16.9 g/dL (ref 13.0–17.0)
MCH: 29.1 pg (ref 26.0–34.0)
MCHC: 34.5 g/dL (ref 30.0–36.0)
MCV: 84.5 fL (ref 80.0–100.0)
Platelets: 264 10*3/uL (ref 150–400)
RBC: 5.8 MIL/uL (ref 4.22–5.81)
RDW: 12.8 % (ref 11.5–15.5)
WBC: 7.1 10*3/uL (ref 4.0–10.5)
nRBC: 0 % (ref 0.0–0.2)

## 2019-03-19 LAB — BASIC METABOLIC PANEL
Anion gap: 13 (ref 5–15)
BUN: 7 mg/dL (ref 6–20)
CO2: 27 mmol/L (ref 22–32)
Calcium: 9.8 mg/dL (ref 8.9–10.3)
Chloride: 96 mmol/L — ABNORMAL LOW (ref 98–111)
Creatinine, Ser: 1.07 mg/dL (ref 0.61–1.24)
GFR calc Af Amer: >60 mL/min (ref >60)
GFR calc non Af Amer: >60 mL/min (ref >60)
Glucose, Bld: 95 mg/dL (ref 70–99)
Potassium: 3.8 mmol/L (ref 3.5–5.1)
Sodium: 136 mmol/L (ref 135–145)

## 2019-03-19 LAB — TYPE AND SCREEN
ABO/RH(D): O NEG
Antibody Screen: NEGATIVE

## 2019-03-19 LAB — ABO/RH: ABO/RH(D): O NEG

## 2019-03-19 NOTE — Progress Notes (Signed)
PCP - Crist Infante, MD Cardiologist - denies  PPM/ICD - denies Device Orders - N/A Rep Notified - N/A  Chest x-ray - N/A EKG - 03/19/2019 Stress Test - per patient, negative results > 10 years ago, done at [facility] - denies  Sleep Study - denies CPAP - N/A  Fasting Blood Sugar - N/A   Blood Thinner Instructions: N/A Aspirin Instructions: N/A  ERAS Protcol - N/A PRE-SURGERY Ensure or G2- N/A  COVID TEST- 03/19/2019   Anesthesia review: No  Patient denies shortness of breath, fever, cough and chest pain at PAT appointment   Coronavirus Screening  Have you experienced the following symptoms:  Cough yes/no: No Fever (>100.26F)  yes/no: No Runny nose yes/no: No Sore throat yes/no: No Difficulty breathing/shortness of breath  yes/no: No  Have you or a family member traveled in the last 14 days and where? yes/no: No   If the patient indicates "YES" to the above questions, their PAT will be rescheduled to limit the exposure to others and, the surgeon will be notified. THE PATIENT WILL NEED TO BE ASYMPTOMATIC FOR 14 DAYS.   If the patient is not experiencing any of these symptoms, the PAT nurse will instruct them to NOT bring anyone with them to their appointment since they may have these symptoms or traveled as well.   Please remind your patients and families that hospital visitation restrictions are in effect and the importance of the restrictions.     All instructions explained to the patient, with a verbal understanding of the material. Patient agrees to go over the instructions while at home for a better understanding. Patient also instructed to self quarantine after being tested for COVID-19. The opportunity to ask questions was provided.

## 2019-03-19 NOTE — H&P (Signed)
Patient ID:   [id number] Patient: Spencer Castro  Date of Birth: 1965-09-27 Visit Type: Office Visit   Date: 02/13/2019 10:30 AM Provider: Marchia Meiers. Vertell Limber MD   This 53 year old male presents for back pain.  HISTORY OF PRESENT ILLNESS: 1.  back pain  Spencer Castro, 53 year old male employed with [employer], returns for evaluation.  Last seen by Dr. Vertell Limber in 2014, he has continued to follow with Dr. Maryjean Ka for pain management and periodic epidural injections.  Patient notes epidurals no longer offer more than 3 weeks relief.  Current symptoms include lumbar and right greater than left thigh pain.  He also notes numbness in the great toe bilaterally.  Symptoms increasing over the past year, he has been out of work since December 2019, unable to participate in typical daily activities due to increased pain.  Exalgo 60 mg b.i.d. Dilaudid 4 mg Q 6 hours Flexeril 10 mg b.i.d.  History:  HTN Surgical history:  Spinal cord stimulator IPG change December 2019, right carpal tunnel release 2012, spinal cord stimulator revision 2012, L4-5 fusion date?  10/19/2018 CT myelogram on [place]  The patient returns today complaining of severe and unrelenting pain in his low back and both thighs, right greater than left.  He says he is only able to get relief when he lays down at is in a  0  G position and take the pressure off his back or when he hangs his legs down in a pool.  He complains of right greater than left leg pain and says it goes in his right leg down to his calf and in his left leg it is more to the thigh and stops at his knee.  He has to lean forward to stand up otherwise he has severe pain..  I reviewed the patient's imaging.  This shows solid arthrodesis at the previously operated L4-5 level.  This was a lumbar fusion that was performed by Dr. Patrice Paradise in 2002. The current pathology is extensive degeneration at the L3-4 level with Modic endplate changes and severe spinal  stenosis.      Medical/Surgical/Interim History Reviewed, no change.     PAST MEDICAL HISTORY, SURGICAL HISTORY, FAMILY HISTORY, SOCIAL HISTORY AND REVIEW OF SYSTEMS I have reviewed the patient's past medical, surgical, family and social history as well as the comprehensive review of systems as included on the [facility] history form dated 11/20/2018, which I have signed.  Family History: Reviewed, no changes.    Social History: Reviewed, no changes.   MEDICATIONS: (added, continued or stopped this visit) Started Medication Directions Instruction Stopped 12/11/2018 amitriptyline 25 mg tablet TAKE 1 OR 2 TABLETS AT BEDTIME.   12/11/2018 cyclobenzaprine 10 mg tablet take 1 tablet by oral route 2 times every day   12/11/2018 Dilaudid 4 mg tablet take 1 tablet by oral route  every 6 hours as needed for breakthrough pain (DNF 12/27/18)   12/11/2018 Dilaudid 4 mg tablet take 1 tablet by oral route  every 6 hours as needed for breakthrough pain (DNF 01/26/19)   12/11/2018 Dilaudid 4 mg tablet take 1 tablet by oral route  every 6 hours as needed for breakthrough pain (DNF 02/24/19)   12/11/2018 hydromorphone ER 16 mg tablet,extended release 24 hr 1 TAB PO Q12 hours.  Do not break, crush, dissolve and/or chew (DNF 02/24/19)   12/11/2018 hydromorphone ER 16 mg tablet,extended release 24 hr 1 TAB PO Q12 hours.  Do not break, crush, dissolve and/or chew (DNF 01/26/19)  12/11/2018 hydromorphone ER 16 mg tablet,extended release 24 hr 1 TAB PO Q12 hours.  Do not break, crush, dissolve and/or chew (DNF 12/27/18)    losartan 50 mg-hydrochlorothiazide 12.5 mg tablet take 1 tablet by oral route  every day   07/12/2017 testosterone 30 mg/actuation (1.5 mL) transderm solution metered pump     Valium 10 mg tablet take 1 tablet by oral route 3 times every day      ALLERGIES: Ingredient Reaction Medication  Name Comment PENICILLINS    MORPHINE       REVIEW OF SYSTEMS  See scanned patient registration form, dated 11/20/2018, signed and dated on 02/13/2019  Review of Systems Details System Neg/Pos Details Constitutional Negative Chills, Fatigue, Fever, Malaise, Night sweats, Weight gain and Weight loss. ENMT Negative Ear drainage, Hearing loss, Nasal drainage, Otalgia, Sinus pressure and Sore throat. Eyes Negative Eye discharge, Eye pain and Vision changes. Respiratory Negative Chronic cough, Cough, Dyspnea, Known TB exposure and Wheezing. Cardio Negative Chest pain, Claudication, Edema and Irregular heartbeat/palpitations. GI Negative Abdominal pain, Blood in stool, Change in stool pattern, Constipation, Decreased appetite, Diarrhea, Heartburn, Nausea and Vomiting. GU Negative Dribbling, Dysuria, Erectile dysfunction, Hematuria, Polyuria (Genitourinary), Slow stream, Urinary frequency, Urinary incontinence and Urinary retention. Endocrine Negative Cold intolerance, Heat intolerance, Polydipsia and Polyphagia. Neuro Positive Gait disturbance, Numbness in extremity. Psych Negative Anxiety, Depression and Insomnia. Integumentary Negative Brittle hair, Brittle nails, Change in shape/size of mole(s), Hair loss, Hirsutism, Hives, Pruritus, Rash and Skin lesion. MS Positive Back pain. Hema/Lymph Negative Easy bleeding, Easy bruising and Lymphadenopathy. Allergic/Immuno Negative Contact allergy, Environmental allergies, Food allergies and Seasonal allergies. Reproductive Negative Penile discharge and Sexual dysfunction.  PHYSICAL EXAM:  Vitals Date Temp F BP Pulse Ht In Wt Lb BMI BSA Pain Score 02/13/2019 97.1 154/90 120 72 222.6 30.19  6/10   PHYSICAL EXAM Details General Level of Distress: no acute distress Overall Appearance: normal  Head and Face  Right Left  Fundoscopic Exam:   normal normal    Cardiovascular Cardiac: regular rate and rhythm without murmur  Right Left  Carotid Pulses: normal normal  Respiratory Lungs: clear to auscultation  Neurological Orientation: normal Recent and Remote Memory: normal Attention Span and Concentration:   normal Language: normal Fund of Knowledge: normal  Right Left Sensation: normal normal Upper Extremity Coordination: normal normal  Lower Extremity Coordination: normal normal  Musculoskeletal Gait and Station: normal  Right Left Upper Extremity Muscle Strength: normal normal Lower Extremity Muscle Strength: normal normal Upper Extremity Muscle Tone:  normal normal Lower Extremity Muscle Tone: normal normal   Motor Strength Upper and lower extremity motor strength was tested in the clinically pertinent muscles.     Deep Tendon Reflexes  Right Left Biceps: normal normal Triceps: normal normal Brachioradialis: normal normal Patellar: normal normal Achilles: normal normal  Sensory Sensation was tested at L1 to S1. Any abnormal findings will be noted below.  Right Left L3:  decreased  L4:  decreased  L5: decreased    Cranial Nerves II. Optic Nerve/Visual Fields: normal III. Oculomotor: normal IV. Trochlear: normal V. Trigeminal: normal VI. Abducens: normal VII. Facial: normal VIII. Acoustic/Vestibular: normal IX. Glossopharyngeal: normal X. Vagus: normal XI. Spinal Accessory: normal XII. Hypoglossal: normal  Motor and other Tests Lhermittes: negative Rhomberg: negative Pronator drift: absent     Right Left Hoffman's: normal normal Clonus: normal normal Babinski: normal normal SLR: positive at 20 degrees positive at 20 degrees Patrick's Pearlean Brownie(Faber): negative negative Toe Walk: normal normal Toe Lift: normal normal Heel Walk:  normal normal SI Joint: nontender nontender   Additional Findings:  Patient is unable to stand erect and leans forward to relieve his  pain.    IMPRESSION:  The patient has right greater than left leg pain.  He has spinal stenosis due to disc degeneration at the L3-4 level.  He is not able to work.  He is not able to walk any distance and has to lean forward to relieve his pain.  He has markedly positive straight leg raise bilaterally at 20 and has decreased pin sensation in both of his legs, on the left at L L3 and L4 dermatomes on the right at the L5 dermatome.  PLAN: I have recommended XL IF at L3-4 with lateral plating to relieve nerve compression and spinal stenosis.  We have given him a prescription for an lumbosacral orthosis.  The surgery will be scheduled pending approval from worker's compensation.  Orders: Diagnostic Procedures: Assessment Procedure M54.16 Lumbar Spine- AP/Lat M54.16 Lumbar Spine- AP/Lat/Flex/Ex Instruction(s)/Education: Assessment Instruction I10 Hypertension education Z68.30 Dietary management education, guidance, and counseling Miscellaneous: Assessment  M48.062 LSO Brace  Completed Orders (this encounter) Order Details Reason Side Interpretation Result Initial Treatment Date Region Dietary management education, guidance, and counseling Eat a well rounded diet high in fruit and vegetables       Hypertension education Follow-up with primary physician       Lumbar Spine- AP/Lat/Flex/Ex      02/13/2019 All Levels to All Levels  Assessment/Plan  # Detail Type Description  1. Assessment Lumbar post-laminectomy syndrome (M96.1).     2. Assessment Low back pain, unspecified back pain laterality, with sciatica presence unspecified (M54.5).     3. 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  Plan Orders LSO Brace.     4. Assessment Radiculopathy, lumbar region (M54.16).     5. Assessment Essential (primary) hypertension (I10).     6. Assessment Body mass index (BMI) 30.0-30.9, adult  (Z68.30).  Plan Orders Today's instructions / counseling include(s) Dietary management education, guidance, and counseling. Clinical information/comments: Eat a well rounded diet high in fruit and vegetables.       Pain Management Plan Pain Scale: 6/10. Method: Numeric Pain Intensity Scale.              Provider:  Danae Orleans. Venetia Maxon MD  02/16/2019 02:06 PM    Dictation edited by: Danae Orleans. Venetia Maxon    CC Providers: Rodrigo Ran [facility] [address] [place],  [place]  [address]-   Odette Fraction PhD MD  [address] [place], [place] [address]               Electronically signed by Danae Orleans. Venetia Maxon MD on 02/16/2019 02:06 PM

## 2019-03-20 LAB — NOVEL CORONAVIRUS, NAA (HOSP ORDER, SEND-OUT TO REF LAB; TAT 18-24 HRS): SARS-CoV-2, NAA: NOT DETECTED

## 2019-03-25 ENCOUNTER — Inpatient Hospital Stay (HOSPITAL_COMMUNITY): Admission: RE | Admit: 2019-03-25 | Payer: Self-pay | Source: Ambulatory Visit

## 2019-03-26 ENCOUNTER — Other Ambulatory Visit (HOSPITAL_COMMUNITY)
Admission: RE | Admit: 2019-03-26 | Discharge: 2019-03-26 | Disposition: A | Discharge status: Home / Self Care | Payer: Self-pay | Source: Ambulatory Visit | Attending: Neurosurgery | Admitting: Neurosurgery

## 2019-03-26 DIAGNOSIS — Z01812 Encounter for preprocedural laboratory examination: Secondary | ICD-10-CM | POA: Insufficient documentation

## 2019-03-26 DIAGNOSIS — Z20828 Contact with and (suspected) exposure to other viral communicable diseases: Secondary | ICD-10-CM | POA: Insufficient documentation

## 2019-03-26 LAB — SARS CORONAVIRUS 2 (TAT 6-24 HRS): SARS Coronavirus 2: NEGATIVE

## 2019-03-27 ENCOUNTER — Encounter (HOSPITAL_COMMUNITY): Payer: Self-pay | Admitting: *Deleted

## 2019-03-27 ENCOUNTER — Other Ambulatory Visit: Payer: Self-pay

## 2019-03-27 MED ORDER — VANCOMYCIN HCL 10 G IV SOLR
1500.0000 mg | INTRAVENOUS | Status: AC
  Administered 2019-03-28: 10:00:00 1500 mg via INTRAVENOUS
  Filled 2019-03-27 (×2): qty 1500

## 2019-03-27 NOTE — Progress Notes (Signed)
SDW call completed answered patients questions appropriately Patient still  Had his instructions from his PAT visit on the 3rd, we made corrections on those instructions Patient stated that he understands the instructions and will follow

## 2019-03-28 ENCOUNTER — Encounter (HOSPITAL_COMMUNITY)
Admission: RE | Disposition: A | Discharge status: Home / Self Care | Payer: Self-pay | Source: Home / Self Care | Attending: Neurosurgery

## 2019-03-28 ENCOUNTER — Inpatient Hospital Stay (HOSPITAL_COMMUNITY): Payer: No Typology Code available for payment source | Admitting: Anesthesiology

## 2019-03-28 ENCOUNTER — Encounter (HOSPITAL_COMMUNITY): Payer: Self-pay

## 2019-03-28 ENCOUNTER — Inpatient Hospital Stay (HOSPITAL_COMMUNITY): Payer: Self-pay | Attending: Neurosurgery

## 2019-03-28 ENCOUNTER — Inpatient Hospital Stay (HOSPITAL_COMMUNITY)
Admission: RE | Admit: 2019-03-28 | Discharge: 2019-03-29 | DRG: 460 | Disposition: A | Discharge status: Home / Self Care | Payer: No Typology Code available for payment source | Attending: Neurosurgery | Admitting: Neurosurgery

## 2019-03-28 ENCOUNTER — Other Ambulatory Visit: Payer: Self-pay

## 2019-03-28 DIAGNOSIS — M545 Low back pain: Secondary | ICD-10-CM | POA: Diagnosis present

## 2019-03-28 DIAGNOSIS — I10 Essential (primary) hypertension: Secondary | ICD-10-CM | POA: Diagnosis present

## 2019-03-28 DIAGNOSIS — M4316 Spondylolisthesis, lumbar region: Secondary | ICD-10-CM | POA: Diagnosis present

## 2019-03-28 DIAGNOSIS — M48062 Spinal stenosis, lumbar region with neurogenic claudication: Secondary | ICD-10-CM | POA: Diagnosis present

## 2019-03-28 DIAGNOSIS — Z87891 Personal history of nicotine dependence: Secondary | ICD-10-CM | POA: Diagnosis not present

## 2019-03-28 DIAGNOSIS — M961 Postlaminectomy syndrome, not elsewhere classified: Secondary | ICD-10-CM | POA: Diagnosis present

## 2019-03-28 DIAGNOSIS — Z419 Encounter for procedure for purposes other than remedying health state, unspecified: Secondary | ICD-10-CM

## 2019-03-28 DIAGNOSIS — M5116 Intervertebral disc disorders with radiculopathy, lumbar region: Secondary | ICD-10-CM | POA: Diagnosis present

## 2019-03-28 HISTORY — PX: ANTERIOR LAT LUMBAR FUSION: SHX1168

## 2019-03-28 SURGERY — ANTERIOR LATERAL LUMBAR FUSION 1 LEVEL
Anesthesia: General | Site: Flank | Laterality: Right

## 2019-03-28 MED ORDER — TESTOSTERONE 30 MG/ACT TD SOLN: 3.0000 | Freq: Every day | TRANSDERMAL | Status: DC

## 2019-03-28 MED ORDER — OXYCODONE HCL 5 MG PO TABS: 5.0000 mg | ORAL_TABLET | ORAL | Status: DC | PRN

## 2019-03-28 MED ORDER — CELECOXIB 200 MG PO CAPS
ORAL_CAPSULE | ORAL | Status: AC
  Administered 2019-03-28: 10:00:00 400 mg via ORAL
  Filled 2019-03-28: qty 2

## 2019-03-28 MED ORDER — SODIUM CHLORIDE 0.9% FLUSH: 3.0000 mL | INTRAVENOUS | Status: DC | PRN

## 2019-03-28 MED ORDER — SODIUM CHLORIDE 0.9 % IV SOLN
250.0000 mL | INTRAVENOUS | Status: DC
  Administered 2019-03-28: 18:00:00 via INTRAVENOUS

## 2019-03-28 MED ORDER — ACETAMINOPHEN 325 MG PO TABS: 650.0000 mg | ORAL_TABLET | ORAL | Status: DC | PRN

## 2019-03-28 MED ORDER — SUFENTANIL CITRATE 250 MCG/5ML IV SOLN
0.2500 ug/kg/h | Freq: Once | INTRAVENOUS | Status: AC
  Administered 2019-03-28: 1.5 ug/kg/h via INTRAVENOUS
  Filled 2019-03-28 (×2): qty 5

## 2019-03-28 MED ORDER — HYDROCODONE-ACETAMINOPHEN 5-325 MG PO TABS
2.0000 | ORAL_TABLET | ORAL | Status: DC | PRN
  Administered 2019-03-28 – 2019-03-29 (×2): 2 via ORAL
  Filled 2019-03-28 (×2): qty 2

## 2019-03-28 MED ORDER — AMITRIPTYLINE HCL 25 MG PO TABS: 25.0000 mg | ORAL_TABLET | Freq: Every evening | ORAL | Status: DC | PRN

## 2019-03-28 MED ORDER — DIAZEPAM 5 MG PO TABS
ORAL_TABLET | ORAL | Status: AC
  Filled 2019-03-28: qty 2

## 2019-03-28 MED ORDER — ONDANSETRON HCL 4 MG/2ML IJ SOLN
INTRAMUSCULAR | Status: DC | PRN
  Administered 2019-03-28: 4 mg via INTRAVENOUS

## 2019-03-28 MED ORDER — LISDEXAMFETAMINE DIMESYLATE 30 MG PO CAPS
30.0000 mg | ORAL_CAPSULE | Freq: Every day | ORAL | Status: DC
  Administered 2019-03-29: 30 mg via ORAL
  Filled 2019-03-28: qty 1

## 2019-03-28 MED ORDER — FENTANYL CITRATE (PF) 250 MCG/5ML IJ SOLN
INTRAMUSCULAR | Status: AC
  Filled 2019-03-28: qty 5

## 2019-03-28 MED ORDER — HYDROCHLOROTHIAZIDE 12.5 MG PO CAPS
12.5000 mg | ORAL_CAPSULE | Freq: Every evening | ORAL | Status: DC
  Administered 2019-03-28 – 2019-03-29 (×2): 12.5 mg via ORAL
  Filled 2019-03-28 (×2): qty 1

## 2019-03-28 MED ORDER — PHENYLEPHRINE 40 MCG/ML (10ML) SYRINGE FOR IV PUSH (FOR BLOOD PRESSURE SUPPORT)
PREFILLED_SYRINGE | INTRAVENOUS | Status: DC | PRN
  Administered 2019-03-28: 200 ug via INTRAVENOUS
  Administered 2019-03-28: 80 ug via INTRAVENOUS
  Administered 2019-03-28: 40 ug via INTRAVENOUS
  Administered 2019-03-28: 80 ug via INTRAVENOUS

## 2019-03-28 MED ORDER — PHENYLEPHRINE HCL-NACL 10-0.9 MG/250ML-% IV SOLN
INTRAVENOUS | Status: DC | PRN
  Administered 2019-03-28: 50 ug/min via INTRAVENOUS

## 2019-03-28 MED ORDER — PHENYLEPHRINE HCL-NACL 10-0.9 MG/250ML-% IV SOLN: INTRAVENOUS | Status: DC | PRN

## 2019-03-28 MED ORDER — ACETAMINOPHEN 500 MG PO TABS
1000.0000 mg | ORAL_TABLET | Freq: Once | ORAL | Status: AC
  Administered 2019-03-28: 10:00:00 1000 mg via ORAL

## 2019-03-28 MED ORDER — LIDOCAINE-EPINEPHRINE 1 %-1:100000 IJ SOLN
INTRAMUSCULAR | Status: DC | PRN
  Administered 2019-03-28: 5 mL

## 2019-03-28 MED ORDER — KCL IN DEXTROSE-NACL 20-5-0.45 MEQ/L-%-% IV SOLN: INTRAVENOUS | Status: DC

## 2019-03-28 MED ORDER — PROMETHAZINE HCL 25 MG/ML IJ SOLN: 6.2500 mg | INTRAMUSCULAR | Status: DC | PRN

## 2019-03-28 MED ORDER — CHLORHEXIDINE GLUCONATE CLOTH 2 % EX PADS: 6.0000 | MEDICATED_PAD | Freq: Once | CUTANEOUS | Status: DC

## 2019-03-28 MED ORDER — PROPOFOL 10 MG/ML IV BOLUS
INTRAVENOUS | Status: DC | PRN
  Administered 2019-03-28: 200 mg via INTRAVENOUS

## 2019-03-28 MED ORDER — VITAMIN D 25 MCG (1000 UNIT) PO TABS
5000.0000 [IU] | ORAL_TABLET | Freq: Every day | ORAL | Status: DC
  Administered 2019-03-29: 10:00:00 5000 [IU] via ORAL
  Filled 2019-03-28: qty 5

## 2019-03-28 MED ORDER — MENTHOL 3 MG MT LOZG: 1.0000 | LOZENGE | OROMUCOSAL | Status: DC | PRN

## 2019-03-28 MED ORDER — SCOPOLAMINE 1 MG/3DAYS TD PT72
1.0000 | MEDICATED_PATCH | TRANSDERMAL | Status: DC
  Administered 2019-03-28: 10:00:00 1.5 mg via TRANSDERMAL

## 2019-03-28 MED ORDER — PHENOL 1.4 % MT LIQD: 1.0000 | OROMUCOSAL | Status: DC | PRN

## 2019-03-28 MED ORDER — ACETAMINOPHEN 500 MG PO TABS
ORAL_TABLET | ORAL | Status: AC
  Administered 2019-03-28: 1000 mg via ORAL
  Filled 2019-03-28: qty 2

## 2019-03-28 MED ORDER — DOCUSATE SODIUM 100 MG PO CAPS
100.0000 mg | ORAL_CAPSULE | Freq: Two times a day (BID) | ORAL | Status: DC
  Administered 2019-03-28 – 2019-03-29 (×2): 100 mg via ORAL
  Filled 2019-03-28 (×2): qty 1

## 2019-03-28 MED ORDER — ACETAMINOPHEN 650 MG RE SUPP: 650.0000 mg | RECTAL | Status: DC | PRN

## 2019-03-28 MED ORDER — FENTANYL CITRATE (PF) 250 MCG/5ML IJ SOLN
INTRAMUSCULAR | Status: DC | PRN
  Administered 2019-03-28 (×2): 50 ug via INTRAVENOUS

## 2019-03-28 MED ORDER — SODIUM CHLORIDE 0.9% FLUSH
3.0000 mL | Freq: Two times a day (BID) | INTRAVENOUS | Status: DC
  Administered 2019-03-28 – 2019-03-29 (×2): 3 mL via INTRAVENOUS

## 2019-03-28 MED ORDER — DEXAMETHASONE SODIUM PHOSPHATE 10 MG/ML IJ SOLN
INTRAMUSCULAR | Status: DC | PRN
  Administered 2019-03-28: 4 mg via INTRAVENOUS

## 2019-03-28 MED ORDER — SUCCINYLCHOLINE CHLORIDE 20 MG/ML IJ SOLN
INTRAMUSCULAR | Status: DC | PRN
  Administered 2019-03-28: 100 mg via INTRAVENOUS

## 2019-03-28 MED ORDER — LACTATED RINGERS IV SOLN
INTRAVENOUS | Status: DC | PRN
  Administered 2019-03-28 (×2): via INTRAVENOUS

## 2019-03-28 MED ORDER — THROMBIN 5000 UNITS EX SOLR
OROMUCOSAL | Status: DC | PRN
  Administered 2019-03-28: 13:00:00 via TOPICAL

## 2019-03-28 MED ORDER — ALUM & MAG HYDROXIDE-SIMETH 200-200-20 MG/5ML PO SUSP: 30.0000 mL | Freq: Four times a day (QID) | ORAL | Status: DC | PRN

## 2019-03-28 MED ORDER — SCOPOLAMINE 1 MG/3DAYS TD PT72
MEDICATED_PATCH | TRANSDERMAL | Status: AC
  Administered 2019-03-28: 1.5 mg via TRANSDERMAL
  Filled 2019-03-28: qty 1

## 2019-03-28 MED ORDER — BUPIVACAINE HCL (PF) 0.5 % IJ SOLN
INTRAMUSCULAR | Status: DC | PRN
  Administered 2019-03-28: 5 mL

## 2019-03-28 MED ORDER — EPHEDRINE SULFATE 50 MG/ML IJ SOLN
INTRAMUSCULAR | Status: DC | PRN
  Administered 2019-03-28: 10 mg via INTRAVENOUS
  Administered 2019-03-28: 15 mg via INTRAVENOUS
  Administered 2019-03-28: 25 mg via INTRAVENOUS
  Administered 2019-03-28: 15 mg via INTRAVENOUS

## 2019-03-28 MED ORDER — METHOCARBAMOL 500 MG PO TABS
500.0000 mg | ORAL_TABLET | Freq: Four times a day (QID) | ORAL | Status: DC | PRN
  Administered 2019-03-28: 15:00:00 500 mg via ORAL

## 2019-03-28 MED ORDER — FENTANYL CITRATE (PF) 100 MCG/2ML IJ SOLN
25.0000 ug | INTRAMUSCULAR | Status: DC | PRN
  Administered 2019-03-28 (×3): 50 ug via INTRAVENOUS

## 2019-03-28 MED ORDER — ONDANSETRON HCL 4 MG/2ML IJ SOLN: 4.0000 mg | Freq: Four times a day (QID) | INTRAMUSCULAR | Status: DC | PRN

## 2019-03-28 MED ORDER — LIDOCAINE 5 % EX PTCH
1.0000 | MEDICATED_PATCH | Freq: Every day | CUTANEOUS | Status: DC | PRN
  Filled 2019-03-28: qty 2

## 2019-03-28 MED ORDER — LOSARTAN POTASSIUM-HCTZ 50-12.5 MG PO TABS: 1.0000 | ORAL_TABLET | Freq: Every evening | ORAL | Status: DC

## 2019-03-28 MED ORDER — LIDOCAINE 2% (20 MG/ML) 5 ML SYRINGE
INTRAMUSCULAR | Status: DC | PRN
  Administered 2019-03-28: 60 mg via INTRAVENOUS

## 2019-03-28 MED ORDER — PANTOPRAZOLE SODIUM 40 MG IV SOLR
40.0000 mg | Freq: Every day | INTRAVENOUS | Status: DC
  Administered 2019-03-28: 40 mg via INTRAVENOUS
  Filled 2019-03-28: qty 40

## 2019-03-28 MED ORDER — DIAZEPAM 5 MG PO TABS
10.0000 mg | ORAL_TABLET | Freq: Three times a day (TID) | ORAL | Status: DC
  Administered 2019-03-28 – 2019-03-29 (×4): 10 mg via ORAL
  Filled 2019-03-28 (×3): qty 2

## 2019-03-28 MED ORDER — CELECOXIB 200 MG PO CAPS
400.0000 mg | ORAL_CAPSULE | Freq: Once | ORAL | Status: AC
  Administered 2019-03-28: 10:00:00 400 mg via ORAL

## 2019-03-28 MED ORDER — BISACODYL 10 MG RE SUPP: 10.0000 mg | Freq: Every day | RECTAL | Status: DC | PRN

## 2019-03-28 MED ORDER — ONDANSETRON HCL 4 MG PO TABS: 4.0000 mg | ORAL_TABLET | Freq: Four times a day (QID) | ORAL | Status: DC | PRN

## 2019-03-28 MED ORDER — METHOCARBAMOL 500 MG PO TABS
ORAL_TABLET | ORAL | Status: AC
  Filled 2019-03-28: qty 1

## 2019-03-28 MED ORDER — HYDROMORPHONE HCL ER 8 MG PO TB24: 16.0000 mg | ORAL_TABLET | Freq: Two times a day (BID) | ORAL | Status: DC

## 2019-03-28 MED ORDER — FENTANYL CITRATE (PF) 100 MCG/2ML IJ SOLN
INTRAMUSCULAR | Status: AC
  Filled 2019-03-28: qty 2

## 2019-03-28 MED ORDER — HYDROMORPHONE HCL 1 MG/ML IJ SOLN
1.0000 mg | INTRAMUSCULAR | Status: DC | PRN
  Administered 2019-03-28 – 2019-03-29 (×6): 1 mg via INTRAVENOUS
  Filled 2019-03-28 (×6): qty 1

## 2019-03-28 MED ORDER — BUPIVACAINE HCL (PF) 0.5 % IJ SOLN
INTRAMUSCULAR | Status: AC
  Filled 2019-03-28: qty 30

## 2019-03-28 MED ORDER — AMITRIPTYLINE HCL 25 MG PO TABS
25.0000 mg | ORAL_TABLET | Freq: Every day | ORAL | Status: DC
  Administered 2019-03-28: 22:00:00 25 mg via ORAL
  Filled 2019-03-28: qty 1

## 2019-03-28 MED ORDER — CYANOCOBALAMIN 500 MCG PO TABS
2500.0000 ug | ORAL_TABLET | Freq: Every day | ORAL | Status: DC
  Filled 2019-03-28 (×2): qty 5

## 2019-03-28 MED ORDER — MIDAZOLAM HCL 2 MG/2ML IJ SOLN
INTRAMUSCULAR | Status: AC
  Filled 2019-03-28: qty 2

## 2019-03-28 MED ORDER — HYDROMORPHONE HCL ER 16 MG PO TB24
16.0000 mg | ORAL_TABLET | Freq: Two times a day (BID) | ORAL | Status: DC
  Administered 2019-03-28 – 2019-03-29 (×2): 16 mg via ORAL
  Filled 2019-03-28: qty 1

## 2019-03-28 MED ORDER — LOSARTAN POTASSIUM 50 MG PO TABS
50.0000 mg | ORAL_TABLET | Freq: Every evening | ORAL | Status: DC
  Administered 2019-03-28: 50 mg via ORAL
  Filled 2019-03-28: qty 1

## 2019-03-28 MED ORDER — METHOCARBAMOL 1000 MG/10ML IJ SOLN
500.0000 mg | Freq: Four times a day (QID) | INTRAVENOUS | Status: DC | PRN
  Filled 2019-03-28 (×3): qty 5

## 2019-03-28 MED ORDER — MIDAZOLAM HCL 5 MG/5ML IJ SOLN
INTRAMUSCULAR | Status: DC | PRN
  Administered 2019-03-28: 2 mg via INTRAVENOUS

## 2019-03-28 MED ORDER — CYCLOBENZAPRINE HCL 10 MG PO TABS
10.0000 mg | ORAL_TABLET | Freq: Two times a day (BID) | ORAL | Status: DC
  Administered 2019-03-28 – 2019-03-29 (×2): 10 mg via ORAL
  Filled 2019-03-28 (×2): qty 1

## 2019-03-28 MED ORDER — ZOLPIDEM TARTRATE 5 MG PO TABS: 5.0000 mg | ORAL_TABLET | Freq: Every evening | ORAL | Status: DC | PRN

## 2019-03-28 MED ORDER — POLYETHYLENE GLYCOL 3350 17 G PO PACK: 17.0000 g | PACK | Freq: Every day | ORAL | Status: DC | PRN

## 2019-03-28 MED ORDER — VANCOMYCIN HCL 10 G IV SOLR
1500.0000 mg | Freq: Once | INTRAVENOUS | Status: AC
  Administered 2019-03-28: 23:00:00 1500 mg via INTRAVENOUS
  Filled 2019-03-28 (×3): qty 1500

## 2019-03-28 MED ORDER — PROPOFOL 10 MG/ML IV BOLUS
INTRAVENOUS | Status: AC
  Filled 2019-03-28: qty 20

## 2019-03-28 MED ORDER — FLEET ENEMA 7-19 GM/118ML RE ENEM: 1.0000 | ENEMA | Freq: Once | RECTAL | Status: DC | PRN

## 2019-03-28 MED ORDER — THROMBIN 5000 UNITS EX SOLR
CUTANEOUS | Status: AC
  Filled 2019-03-28: qty 5000

## 2019-03-28 MED ORDER — LIDOCAINE-EPINEPHRINE 1 %-1:100000 IJ SOLN
INTRAMUSCULAR | Status: AC
  Filled 2019-03-28: qty 1

## 2019-03-28 MED ORDER — EXALGO 16 MG PO T24A: 16.0000 mg | EXTENDED_RELEASE_TABLET | Freq: Two times a day (BID) | ORAL | Status: DC

## 2019-03-28 MED ORDER — 0.9 % SODIUM CHLORIDE (POUR BTL) OPTIME
TOPICAL | Status: DC | PRN
  Administered 2019-03-28: 1000 mL

## 2019-03-28 SURGICAL SUPPLY — 65 items
BLADE CLIPPER SURG (BLADE) IMPLANT
BOLT PLATE XLIF 5.5X55 LRG (Bolt) ×4 IMPLANT
BOLT PLATE XLIF 5.5X55MM LRG (Bolt) ×2 IMPLANT
CARTRIDGE OIL MAESTRO DRILL (MISCELLANEOUS) IMPLANT
COVER WAND RF STERILE (DRAPES) IMPLANT
DECANTER SPIKE VIAL GLASS SM (MISCELLANEOUS) ×3 IMPLANT
DERMABOND ADVANCED (GAUZE/BANDAGES/DRESSINGS) ×2
DERMABOND ADVANCED .7 DNX12 (GAUZE/BANDAGES/DRESSINGS) ×1 IMPLANT
DIFFUSER DRILL AIR PNEUMATIC (MISCELLANEOUS) IMPLANT
DRAPE C-ARM 42X72 X-RAY (DRAPES) ×3 IMPLANT
DRAPE C-ARMOR (DRAPES) ×3 IMPLANT
DRAPE LAPAROTOMY 100X72X124 (DRAPES) ×3 IMPLANT
DRSG OPSITE POSTOP 3X4 (GAUZE/BANDAGES/DRESSINGS) ×3 IMPLANT
DRSG OPSITE POSTOP 4X6 (GAUZE/BANDAGES/DRESSINGS) ×6 IMPLANT
DURAPREP 26ML APPLICATOR (WOUND CARE) ×3 IMPLANT
ELECT REM PT RETURN 9FT ADLT (ELECTROSURGICAL) ×3
ELECTRODE REM PT RTRN 9FT ADLT (ELECTROSURGICAL) ×1 IMPLANT
GAUZE 4X4 16PLY RFD (DISPOSABLE) IMPLANT
GLOVE BIO SURGEON STRL SZ8 (GLOVE) ×3 IMPLANT
GLOVE BIOGEL PI IND STRL 6.5 (GLOVE) ×2 IMPLANT
GLOVE BIOGEL PI IND STRL 7.0 (GLOVE) ×1 IMPLANT
GLOVE BIOGEL PI IND STRL 7.5 (GLOVE) ×1 IMPLANT
GLOVE BIOGEL PI IND STRL 8 (GLOVE) ×1 IMPLANT
GLOVE BIOGEL PI IND STRL 8.5 (GLOVE) ×2 IMPLANT
GLOVE BIOGEL PI INDICATOR 6.5 (GLOVE) ×4
GLOVE BIOGEL PI INDICATOR 7.0 (GLOVE) ×2
GLOVE BIOGEL PI INDICATOR 7.5 (GLOVE) ×2
GLOVE BIOGEL PI INDICATOR 8 (GLOVE) ×2
GLOVE BIOGEL PI INDICATOR 8.5 (GLOVE) ×4
GLOVE ECLIPSE 6.5 STRL STRAW (GLOVE) ×9 IMPLANT
GLOVE ECLIPSE 8.0 STRL XLNG CF (GLOVE) ×3 IMPLANT
GLOVE ECLIPSE 8.5 STRL (GLOVE) ×3 IMPLANT
GLOVE EXAM NITRILE XL STR (GLOVE) IMPLANT
GLOVE SURG SS PI 7.0 STRL IVOR (GLOVE) ×3 IMPLANT
GOWN STRL REUS W/ TWL LRG LVL3 (GOWN DISPOSABLE) ×2 IMPLANT
GOWN STRL REUS W/ TWL XL LVL3 (GOWN DISPOSABLE) ×3 IMPLANT
GOWN STRL REUS W/TWL 2XL LVL3 (GOWN DISPOSABLE) ×3 IMPLANT
GOWN STRL REUS W/TWL LRG LVL3 (GOWN DISPOSABLE) ×4
GOWN STRL REUS W/TWL XL LVL3 (GOWN DISPOSABLE) ×6
HEMOSTAT POWDER KIT SURGIFOAM (HEMOSTASIS) ×3 IMPLANT
KIT BASIN OR (CUSTOM PROCEDURE TRAY) ×3 IMPLANT
KIT DILATOR XLIF 5 (KITS) ×2 IMPLANT
KIT INFUSE XX SMALL 0.7CC (Orthopedic Implant) ×3 IMPLANT
KIT SURGICAL ACCESS MAXCESS 4 (KITS) ×3 IMPLANT
KIT TURNOVER KIT B (KITS) ×3 IMPLANT
KIT XLIF (KITS) ×1
MODULE NVM5 NEXT GEN EMG (NEEDLE) ×3 IMPLANT
MODULUS XLW 12X22X55MM 10 (Spine Construct) ×3 IMPLANT
NEEDLE HYPO 25X1 1.5 SAFETY (NEEDLE) ×3 IMPLANT
NS IRRIG 1000ML POUR BTL (IV SOLUTION) ×3 IMPLANT
OIL CARTRIDGE MAESTRO DRILL (MISCELLANEOUS)
PACK LAMINECTOMY NEURO (CUSTOM PROCEDURE TRAY) ×3 IMPLANT
PLATE DECADE XLIP 2H SZ12 (Plate) ×3 IMPLANT
PUTTY BONE ATTRAX 5CC STRIP (Putty) ×3 IMPLANT
SPONGE LAP 4X18 RFD (DISPOSABLE) IMPLANT
SPONGE SURGIFOAM ABS GEL SZ50 (HEMOSTASIS) IMPLANT
STAPLER SKIN PROX WIDE 3.9 (STAPLE) ×3 IMPLANT
SUT VIC AB 1 CT1 18XBRD ANBCTR (SUTURE) ×1 IMPLANT
SUT VIC AB 1 CT1 8-18 (SUTURE) ×2
SUT VIC AB 2-0 CT1 18 (SUTURE) ×3 IMPLANT
SUT VIC AB 3-0 SH 8-18 (SUTURE) ×3 IMPLANT
TOWEL GREEN STERILE (TOWEL DISPOSABLE) IMPLANT
TOWEL GREEN STERILE FF (TOWEL DISPOSABLE) IMPLANT
TRAY FOLEY MTR SLVR 16FR STAT (SET/KITS/TRAYS/PACK) ×3 IMPLANT
WATER STERILE IRR 1000ML POUR (IV SOLUTION) ×3 IMPLANT

## 2019-03-28 NOTE — Progress Notes (Signed)
Awake, alert, conversant.  MAEW with good power.  Sore in back.  Doing well.   

## 2019-03-28 NOTE — Progress Notes (Signed)
Pharmacy Antibiotic Note  Spencer Castro is a 53 y.o. male admitted on 03/28/2019 with lumbar stenosis with neurogenic claudication.  Pt is S/P right L3-4 anterolateral decompression/interbody fusion with lateral plate procedure this afternoon. Pharmacy has been consulted for post-op vancomycin dosing.  Pt rec'd vancomycin 1500 mg IV pre-op at 10:09 AM today  Scr 1.07, CrCl 100 ml/min, WBC 7.1, afebrile  Per neurosurgeon note, pt has no drains in place post-op  Plan: Vancomycin 1500 mg IV X 1 ~12 hrs after pre-op vancomycin dose (scheduled for 2200 this evening)  Height: 6' (182.9 cm) Weight: 220 lb 7.4 oz (100 kg) IBW/kg (Calculated) : 77.6  Temp (24hrs), Avg:97.7 F (36.5 C), Min:97 F (36.1 C), Max:98.2 F (36.8 C)  No results for input(s): WBC, CREATININE, LATICACIDVEN, VANCOTROUGH, VANCOPEAK, VANCORANDOM, GENTTROUGH, GENTPEAK, GENTRANDOM, TOBRATROUGH, TOBRAPEAK, TOBRARND, AMIKACINPEAK, AMIKACINTROU, AMIKACIN in the last 168 hours.  Estimated Creatinine Clearance: 100 mL/min (by C-G formula based on SCr of 1.07 mg/dL).    Allergies  Allergen Reactions  . Nabumetone Other (See Comments)    Severe stomach pain and cramping  . Morphine And Related Itching  . Penicillins Rash    Childhood reaction Did it involve swelling of the face/tongue/throat, SOB, or low BP? No Did it involve sudden or severe rash/hives, skin peeling, or any reaction on the inside of your mouth or nose? No Did you need to seek medical attention at a hospital or doctor's office? No When did it last happen?childhood If all above answers are "NO", may proceed with cephalosporin use.     Microbiology results: 11/10 COVID: negative 11/10 MRSA PCR: negative  Thank you for allowing pharmacy to be a part of this patient's care.  Gillermina Hu, PharmD, BCPS Clinical Pharmacist 03/28/2019 4:08 PM

## 2019-03-28 NOTE — Anesthesia Preprocedure Evaluation (Addendum)
Anesthesia Evaluation  Patient identified by MRN, date of birth, ID band Patient awake    Reviewed: Allergy & Precautions, NPO status , Patient's Chart, lab work & pertinent test results  History of Anesthesia Complications Negative for: history of anesthetic complications  Airway Mallampati: II  TM Distance: >3 FB Neck ROM: Full    Dental no notable dental hx. (+) Dental Advisory Given   Pulmonary former smoker,    Pulmonary exam normal        Cardiovascular hypertension, Pt. on medications Normal cardiovascular exam     Neuro/Psych PSYCHIATRIC DISORDERS Anxiety negative psych ROS   GI/Hepatic negative GI ROS, Neg liver ROS,   Endo/Other  negative endocrine ROS  Renal/GU negative Renal ROS  negative genitourinary   Musculoskeletal negative musculoskeletal ROS (+)   Abdominal   Peds negative pediatric ROS (+)  Hematology negative hematology ROS (+)   Anesthesia Other Findings   Reproductive/Obstetrics negative OB ROS                            Anesthesia Physical Anesthesia Plan  ASA: II  Anesthesia Plan: General   Post-op Pain Management:    Induction: Intravenous  PONV Risk Score and Plan: 3 and Ondansetron, Dexamethasone and Scopolamine patch - Pre-op  Airway Management Planned: Oral ETT  Additional Equipment:   Intra-op Plan:   Post-operative Plan: Extubation in OR  Informed Consent: I have reviewed the patients History and Physical, chart, labs and discussed the procedure including the risks, benefits and alternatives for the proposed anesthesia with the patient or authorized representative who has indicated his/her understanding and acceptance.     Dental advisory given  Plan Discussed with: CRNA, Anesthesiologist and Surgeon  Anesthesia Plan Comments:        Anesthesia Quick Evaluation

## 2019-03-28 NOTE — Anesthesia Procedure Notes (Addendum)
Procedure Name: Intubation Date/Time: 03/28/2019 11:46 AM Performed by: Mariea Clonts, CRNA Pre-anesthesia Checklist: Patient identified, Emergency Drugs available, Suction available and Patient being monitored Patient Re-evaluated:Patient Re-evaluated prior to induction Oxygen Delivery Method: Circle System Utilized Preoxygenation: Pre-oxygenation with 100% oxygen Induction Type: IV induction Laryngoscope Size: Miller and 3 Grade View: Grade I Tube type: Oral Tube size: 8.0 mm Number of attempts: 1 Airway Equipment and Method: Stylet Placement Confirmation: ETT inserted through vocal cords under direct vision,  positive ETCO2 and breath sounds checked- equal and bilateral Secured at: 22 cm Tube secured with: Tape Dental Injury: Teeth and Oropharynx as per pre-operative assessment

## 2019-03-28 NOTE — Progress Notes (Signed)
Orthopedic Tech Progress Note Patient Details:  Spencer Castro [birth year] [id number] Patient has brace Patient ID: Abbott Pao, male   DOB: [birth year], 53 y.o.   MRN: [id number]   Janit Pagan 03/28/2019, 5:28 PM

## 2019-03-28 NOTE — Transfer of Care (Signed)
Immediate Anesthesia Transfer of Care Note  Patient: Spencer Castro  Procedure(s) Performed: Right Lumbar three-four Anterolateral decompression/interbody fusion with lateral plate (Right Flank)  Patient Location: PACU  Anesthesia Type:General  Level of Consciousness: awake, alert  and oriented  Airway & Oxygen Therapy: Patient Spontanous Breathing and Patient connected to nasal cannula oxygen  Post-op Assessment: Report given to RN, Post -op Vital signs reviewed and stable and Patient moving all extremities X 4  Post vital signs: Reviewed and stable  Last Vitals:  Vitals Value Taken Time  BP 139/96 03/28/19 1354  Temp    Pulse 83 03/28/19 1359  Resp 20 03/28/19 1359  SpO2 95 % 03/28/19 1359  Vitals shown include unvalidated device data.  Last Pain:  Vitals:   03/28/19 0938  TempSrc:   PainSc: 5          Complications: No apparent anesthesia complications

## 2019-03-28 NOTE — Interval H&P Note (Signed)
History and Physical Interval Note:  03/28/2019 10:42 AM  Spencer Castro  has presented today for surgery, with the diagnosis of Lumbar stenosis with neurogenic claudication.  The various methods of treatment have been discussed with the patient and family. After consideration of risks, benefits and other options for treatment, the patient has consented to  Procedure(s) with comments: Right Lumbar 3-4 Anterolateral decompression/interbody fusion with lateral plate (Right) - Right Lumbar 3-4 Anterolateral decompression/interbody fusion with lateral plate as a surgical intervention.  The patient's history has been reviewed, patient examined, no change in status, stable for surgery.  I have reviewed the patient's chart and labs.  Questions were answered to the patient's satisfaction.     Peggyann Shoals

## 2019-03-28 NOTE — Anesthesia Postprocedure Evaluation (Signed)
Anesthesia Post Note  Patient: Spencer Castro  Procedure(s) Performed: Right Lumbar three-four Anterolateral decompression/interbody fusion with lateral plate (Right Flank)     Patient location during evaluation: PACU Anesthesia Type: General Level of consciousness: sedated Pain management: pain level controlled Vital Signs Assessment: post-procedure vital signs reviewed and stable Respiratory status: spontaneous breathing and respiratory function stable Cardiovascular status: stable Postop Assessment: no apparent nausea or vomiting Anesthetic complications: no    Last Vitals:  Vitals:   03/28/19 1528 03/28/19 1605  BP:  (!) 147/88  Pulse: 77 92  Resp: (!) 21 18  Temp:  36.8 C  SpO2: 100% 97%    Last Pain:  Vitals:   03/28/19 1605  TempSrc: Oral  PainSc:                  [name] DANIEL

## 2019-03-28 NOTE — Brief Op Note (Signed)
03/28/2019  1:57 PM  PATIENT:  Spencer Castro  53 y.o. male  PRE-OPERATIVE DIAGNOSIS:  Lumbar stenosis with neurogenic claudication, lumbar spondylolisthesis, herniated lumbar disc, spondylosis, radiculopathy, lumbago L 34 level  POST-OPERATIVE DIAGNOSIS:   Lumbar stenosis with neurogenic claudication, lumbar spondylolisthesis, herniated lumbar disc, spondylosis, radiculopathy, lumbago L 34 level  PROCEDURE:  Procedure(s): Right Lumbar three-four Anterolateral decompression/interbody fusion with lateral plate (Right)  SURGEON:  Surgeon(s) and Role:    Erline Levine, MD - Primary    Kristeen Miss, MD - Assisting  PHYSICIAN ASSISTANT:   ASSISTANTS: Poteat, RN   ANESTHESIA:   general  EBL:  30 mL   BLOOD ADMINISTERED:none  DRAINS: none   LOCAL MEDICATIONS USED:  MARCAINE    and LIDOCAINE   SPECIMEN:  No Specimen  DISPOSITION OF SPECIMEN:  N/A  COUNTS:  YES  TOURNIQUET:  * No tourniquets in log *  DICTATION: Patient is a 53 year old with severe spondylosis stenosis and loss of lordosis of the lumbar spine with spodylolisthesis. It was elected to take him to surgery for anterolateral decompression and lateral plate fixation at the L 34 level.  He had previously undergone fusion L 45 level.  Procedure: Patient was brought to the operating room and placed in a left lateral decubitus position on the operative table and using orthogonally projected C-arm fluoroscopy the patient was placed so that the L 34  level was visualized in AP and lateral plane. The patient was then taped into position. The table was flexed so as to expose the L 34 level. Skin was marked along with a posterior finger dissection incision. His flank was then prepped and draped in usual sterile fashion and incisions were made overlying the  Superior aspect of the iliac crest. Posterior finger dissection was made to enter the retroperitoneal space and then subsequently the probe was inserted into the psoas muscle  from the right side initially at the L34 level. After mapping the neural elements were able to dock the probe per the midpoint of this vertebral level and without indications electrically of too close proximity to the neural tissues. Subsequently the self-retaining tractor was.after sequential dilators were utilized the shim was employed and the interspace was cleared of psoas muscle and then incised. A thorough discectomy was performed. Instruments were used to clear the interspace of disc material. After thorough discectomy was performed and this was performed using AP and lateral fluoroscopy a 12 lordotic by 55 x 22 mm titanium implant was packed with extra extra small BMP and Attrax. This was tamped into position and its position was confirmed on AP and lateral fluoroscopy. A Decade lateral plate (size 12) was affixed to the lateral spine with 5.5 x 55 mm screw at L 4 and 5.5 x 55 mm screw at L 3. All screws were torque locked and positioning was confirmed with AP and lateral fluoroscopy. . Hemostasis was assured the wounds were irrigated interrupted Vicryl sutures.Sterile occlusive dressing was placed with Dermabond and an occlusive dressing. The patient was then extubated in the operating room and taken to recovery in stable and satisfactory condition having tolerated his operation well. Counts were correct at the end of the case.  PLAN OF CARE: Admit to inpatient   PATIENT DISPOSITION:  PACU - hemodynamically stable.   Delay start of Pharmacological VTE agent (>24hrs) due to surgical blood loss or risk of bleeding: yes

## 2019-03-28 NOTE — Op Note (Signed)
03/28/2019  1:57 PM  PATIENT:  Spencer Castro  53 y.o. male  PRE-OPERATIVE DIAGNOSIS:  Lumbar stenosis with neurogenic claudication, lumbar spondylolisthesis, herniated lumbar disc, spondylosis, radiculopathy, lumbago L 34 level  POST-OPERATIVE DIAGNOSIS:   Lumbar stenosis with neurogenic claudication, lumbar spondylolisthesis, herniated lumbar disc, spondylosis, radiculopathy, lumbago L 34 level  PROCEDURE:  Procedure(s): Right Lumbar three-four Anterolateral decompression/interbody fusion with lateral plate (Right)  SURGEON:  Surgeon(s) and Role:    * Masiah Lewing, MD - Primary    * Elsner, Henry, MD - Assisting  PHYSICIAN ASSISTANT:   ASSISTANTS: Poteat, RN   ANESTHESIA:   general  EBL:  30 mL   BLOOD ADMINISTERED:none  DRAINS: none   LOCAL MEDICATIONS USED:  MARCAINE    and LIDOCAINE   SPECIMEN:  No Specimen  DISPOSITION OF SPECIMEN:  N/A  COUNTS:  YES  TOURNIQUET:  * No tourniquets in log *  DICTATION: Patient is a 53-year-old with severe spondylosis stenosis and loss of lordosis of the lumbar spine with spodylolisthesis. It was elected to take him to surgery for anterolateral decompression and lateral plate fixation at the L 34 level.  He had previously undergone fusion L 45 level.  Procedure: Patient was brought to the operating room and placed in a left lateral decubitus position on the operative table and using orthogonally projected C-arm fluoroscopy the patient was placed so that the L 34  level was visualized in AP and lateral plane. The patient was then taped into position. The table was flexed so as to expose the L 34 level. Skin was marked along with a posterior finger dissection incision. His flank was then prepped and draped in usual sterile fashion and incisions were made overlying the  Superior aspect of the iliac crest. Posterior finger dissection was made to enter the retroperitoneal space and then subsequently the probe was inserted into the psoas muscle  from the right side initially at the L34 level. After mapping the neural elements were able to dock the probe per the midpoint of this vertebral level and without indications electrically of too close proximity to the neural tissues. Subsequently the self-retaining tractor was.after sequential dilators were utilized the shim was employed and the interspace was cleared of psoas muscle and then incised. A thorough discectomy was performed. Instruments were used to clear the interspace of disc material. After thorough discectomy was performed and this was performed using AP and lateral fluoroscopy a 12 lordotic by 55 x 22 mm titanium implant was packed with extra extra small BMP and Attrax. This was tamped into position and its position was confirmed on AP and lateral fluoroscopy. A Decade lateral plate (size 12) was affixed to the lateral spine with 5.5 x 55 mm screw at L 4 and 5.5 x 55 mm screw at L 3. All screws were torque locked and positioning was confirmed with AP and lateral fluoroscopy. . Hemostasis was assured the wounds were irrigated interrupted Vicryl sutures.Sterile occlusive dressing was placed with Dermabond and an occlusive dressing. The patient was then extubated in the operating room and taken to recovery in stable and satisfactory condition having tolerated his operation well. Counts were correct at the end of the case.  PLAN OF CARE: Admit to inpatient   PATIENT DISPOSITION:  PACU - hemodynamically stable.   Delay start of Pharmacological VTE agent (>24hrs) due to surgical blood loss or risk of bleeding: yes  

## 2019-03-29 ENCOUNTER — Encounter (HOSPITAL_COMMUNITY): Payer: Self-pay | Admitting: Neurosurgery

## 2019-03-29 MED ORDER — HYDROMORPHONE HCL 2 MG PO TABS: 2.0000 mg | ORAL_TABLET | ORAL | 0 refills | Status: AC | PRN

## 2019-03-29 NOTE — Discharge Summary (Signed)
Physician Discharge Summary  Patient ID: Spencer Castro MRN: [id number] DOB/AGE: 1965-10-20 53 y.o.  Admit date: 03/28/2019 Discharge date: 03/29/2019  Admission Diagnoses: Lumbar stenosis with neurogenic claudication, lumbar spondylolisthesis, herniated lumbar disc, spondylosis, radiculopathy, lumbago L 34 level    Discharge Diagnoses: Lumbar stenosis with neurogenic claudication, lumbar spondylolisthesis, herniated lumbar disc, spondylosis, radiculopathy, lumbago L 34 level s/p Right Lumbar three-four Anterolateral decompression/interbody fusion with lateral plate (Right)    Active Problems:   Spondylolisthesis of lumbar region   Discharged Condition: good  Hospital Course: Vershawn Westrup was admitted for surgery with dx stenosis, spondylolisthesis, and radiculopathy. Following uncomplicated surgery (above), he recovered well. He transferred to 4N for nursing care and therapies.   Consults: None  Significant Diagnostic Studies: radiology: X-Ray: intra-op  Treatments: surgery: Right Lumbar three-four Anterolateral decompression/interbody fusion with lateral plate (Right)    Discharge Exam: Blood pressure 114/68, pulse 82, temperature 98.3 F (36.8 C), temperature source Oral, resp. rate 18, height 6' (1.829 m), weight 100 kg, SpO2 96 %. Alert, conversant. Reports no back pain, improved strength LLE. Reports right lateral thigh pain, much improved from last night. Strength is good BLE (increased strength left foot dorsiflexion vs pre-op). Incisions without erythema, swelling, or drainage beneath honeycomb and Dermabond right side.    Disposition: Discharge to home. Will resume home pain medication regimen. Office f/u in 3-4 weeks.    Allergies as of 03/29/2019      Reactions   Nabumetone Other (See Comments)   Severe stomach pain and cramping   Morphine And Related Itching   Penicillins Rash   Childhood reaction Did it involve swelling of the  face/tongue/throat, SOB, or low BP? No Did it involve sudden or severe rash/hives, skin peeling, or any reaction on the inside of your mouth or nose? No Did you need to seek medical attention at a hospital or doctor's office? No When did it last happen?childhood If all above answers are "NO", may proceed with cephalosporin use.      Medication List    TAKE these medications   amitriptyline 25 MG tablet Commonly known as: ELAVIL Take 25-50 mg by mouth at bedtime.   B-12 2500 MCG Tabs Take 2,500 mcg by mouth daily.   cyclobenzaprine 10 MG tablet Commonly known as: FLEXERIL Take 10 mg by mouth 2 (two) times daily.   diazepam 10 MG tablet Commonly known as: VALIUM Take 10 mg by mouth 3 (three) times daily.   HYDROmorphone 2 MG tablet Commonly known as: Dilaudid Take 1 tablet (2 mg total) by mouth every 4 (four) hours as needed for up to 7 days for severe pain. What changed: You were already taking a medication with the same name, and this prescription was added. Make sure you understand how and when to take each.   Exalgo 16 MG T24a Generic drug: HYDROmorphone HCl Take 16 mg by mouth 2 (two) times daily. What changed: Another medication with the same name was added. Make sure you understand how and when to take each.   lidocaine 5 % Commonly known as: LIDODERM Place 1-2 patches onto the skin daily as needed (pain). Remove & Discard patch within 12 hours or as directed by MD   lisdexamfetamine 30 MG capsule Commonly known as: VYVANSE Take 30 mg by mouth daily.   losartan-hydrochlorothiazide 50-12.5 MG tablet Commonly known as: HYZAAR Take 1 tablet by mouth every evening.   Testosterone 30 MG/ACT Soln Place 3 Pump onto the skin daily.   Vitamin D 125 MCG (  5000 UT) Caps Take 5,000 Units by mouth daily.        Signed: Dorian Heckle, MD 03/29/2019, 8:37 AM

## 2019-03-29 NOTE — Progress Notes (Addendum)
Subjective: Patient reports "I'm so much better than I was. I can lift my leg off the bed"  Objective: Vital signs in last 24 hours: Temp:  [97 F (36.1 C)-98.6 F (37 C)] 98.3 F (36.8 C) (11/13 0353) Pulse Rate:  [71-92] 82 (11/13 0530) Resp:  [13-24] 18 (11/13 0353) BP: (114-160)/(68-101) 114/68 (11/13 0353) SpO2:  [94 %-100 %] 96 % (11/13 0353) Weight:  [100 kg] 100 kg (11/12 0924)  Intake/Output from previous day: 11/12 0701 - 11/13 0700 In: 1560 [P.O.:200; I.V.:1210] Out: 4305 [Urine:4275; Blood:30] Intake/Output this shift: No intake/output data recorded.  Alert, conversant. Reports no back pain, improved strength LLE. Reports right lateral thigh pain, much improved from last night. Strength is good BLE (increased strength left foot dorsiflexion vs pre-op). Incisions without erythema, swelling, or drainage beneath honeycomb and Dermabond right side.   Lab Results: No results for input(s): WBC, HGB, HCT, PLT in the last 72 hours. BMET No results for input(s): NA, K, CL, CO2, GLUCOSE, BUN, CREATININE, CALCIUM in the last 72 hours.  Studies/Results: Dg Lumbar Spine 2-3 Views  Result Date: 03/28/2019 CLINICAL DATA:  L3-L4 XLIF with lateral plate. EXAM: LUMBAR SPINE - 2-3 VIEW; DG C-ARM 1-60 MIN COMPARISON:  Preoperative lumbar spine radiograph 02/13/2019 FINDINGS: Two fluoroscopic spot images in frontal and lateral projections. Placement of lateral plate at F6-B8 with interbody spacer. Posterior fusion hardware at L4-L5 with interbody spacer, as seen on prior radiograph. Spinal stimulator device is partially included. Total fluoroscopy time 2 minutes 18 seconds. IMPRESSION: Intraoperative fluoroscopic spot images during L3-L4 fusion. Electronically Signed   By: Keith Rake M.D.   On: 03/28/2019 13:42   Dg C-arm 1-60 Min  Result Date: 03/28/2019 CLINICAL DATA:  L3-L4 XLIF with lateral plate. EXAM: LUMBAR SPINE - 2-3 VIEW; DG C-ARM 1-60 MIN COMPARISON:  Preoperative lumbar  spine radiograph 02/13/2019 FINDINGS: Two fluoroscopic spot images in frontal and lateral projections. Placement of lateral plate at G6-K5 with interbody spacer. Posterior fusion hardware at L4-L5 with interbody spacer, as seen on prior radiograph. Spinal stimulator device is partially included. Total fluoroscopy time 2 minutes 18 seconds. IMPRESSION: Intraoperative fluoroscopic spot images during L3-L4 fusion. Electronically Signed   By: Keith Rake M.D.   On: 03/28/2019 13:42    Assessment/Plan: improving  LOS: 1 day  Mobilize in brace. Pt hopeful of d/c to home today after PT.    Verdis Prime 03/29/2019, 7:38 AM  Patient is doing well.  Mobilize today and discharge home this afternoon.

## 2019-03-29 NOTE — Evaluation (Signed)
Physical Therapy Evaluation Patient Details Name: Spencer Castro MRN: [id number] DOB: January 05, 1966 Today's Date: 03/29/2019   History of Present Illness   Pt is a 53 yo male s/p R Right Lumbar 3-4 Anterolateral decompression/interbody fusion with lateral plate. PMHx: back sx.  Clinical Impression   Pt presents with mild to moderate back pain, slight RLE weakness vs LLE, good understanding of back precautions due to history of back surgeries, and decreased activity tolerance. Pt to benefit from acute PT to address deficits. Pt ambulated hallway distance without AD, with no LOB noted. Pt limited in distance by back discomfort. Pt proficiently navigated stairs, as pt plans on d/c home today. If not, PT to progress mobility as tolerated, and will continue to follow acutely.      Follow Up Recommendations No PT follow up;Supervision for mobility/OOB    Equipment Recommendations  Rolling walker with 5" wheels    Recommendations for Other Services       Precautions / Restrictions Precautions Precautions: Back Precaution Booklet Issued: Yes (comment) Precaution Comments: OT administered handout, PT verbally reviewed precautions with pt and pt able to state precautions back to PT at end of session Required Braces or Orthoses: Spinal Brace Spinal Brace: Lumbar corset;Applied in sitting position Restrictions Weight Bearing Restrictions: No      Mobility  Bed Mobility Overal bed mobility: Modified Independent             General bed mobility comments: Mod I for increased time, pt able to log roll into and out of bed without assist or verbal cuing.  Transfers Overall transfer level: Needs assistance Equipment used: None Transfers: Sit to/from Stand Sit to Stand: Modified independent (Device/Increase time)         General transfer comment: Mod I for increased time, no physical assist  Ambulation/Gait Ambulation/Gait assistance: Min guard;Supervision Gait Distance (Feet): 250  Feet Assistive device: None Gait Pattern/deviations: Step-through pattern;Decreased stride length;Trunk flexed Gait velocity: decr   General Gait Details: Min guard for supervision for safety, pt with forward flexion at hip girdle but maintenance of back precautions throughout mobility. No unsteadiness noted in standing without use of AD.  Stairs Stairs: Yes Stairs assistance: Min guard Stair Management: One rail Right;Forwards;Alternating pattern Number of Stairs: 12 General stair comments: min guard for safety, no verbal cuing required  Wheelchair Mobility    Modified Rankin (Stroke Patients Only)       Balance Overall balance assessment: No apparent balance deficits (not formally assessed)                                           Pertinent Vitals/Pain Pain Assessment: Faces Faces Pain Scale: Hurts a little bit Pain Location: back Pain Descriptors / Indicators: Sore;Discomfort Pain Intervention(s): Limited activity within patient's tolerance;Monitored during session;Repositioned;Premedicated before session    Home Living Family/patient expects to be discharged to:: Private residence Living Arrangements: Spouse/significant other Available Help at Discharge: Family;Available PRN/intermittently Type of Home: House Home Access: Stairs to enter Entrance Stairs-Rails: Can reach both Entrance Stairs-Number of Steps: 5 Home Layout: One level Home Equipment: Other (comment)(walking stick)      Prior Function Level of Independence: Needs assistance   Gait / Transfers Assistance Needed: ambulates short distances  only  ADL's / Homemaking Assistance Needed: Limited by pain; Pt took increased time to perform tasks; pt cooking small meals  Comments: PTA, pt working for [employer]  for the past 30 years. Pt reports back pain started due to dog attack while on the job in 2002.     Hand Dominance   Dominant Hand: Right    Extremity/Trunk Assessment         Lower Extremity Assessment Lower Extremity Assessment: Generalized weakness RLE Deficits / Details: at least 3+/5 strength bilaterally, formal MMT not completed to avoid strain. Increased time and effort to perform R knee extension, DF.    Cervical / Trunk Assessment Cervical / Trunk Assessment: Other exceptions Cervical / Trunk Exceptions: s/p lumbar sx  Communication   Communication: No difficulties  Cognition Arousal/Alertness: Awake/alert Behavior During Therapy: WFL for tasks assessed/performed Overall Cognitive Status: Within Functional Limits for tasks assessed                                        General Comments      Exercises     Assessment/Plan    PT Assessment Patient needs continued PT services  PT Problem List Decreased mobility;Decreased activity tolerance       PT Treatment Interventions DME instruction;Therapeutic activities;Gait training;Therapeutic exercise;Patient/family education;Stair training;Balance training;Functional mobility training    PT Goals (Current goals can be found in the Care Plan section)  Acute Rehab PT Goals Patient Stated Goal: to go home PT Goal Formulation: With patient Time For Goal Achievement: 04/12/19 Potential to Achieve Goals: Good    Frequency Min 5X/week   Barriers to discharge        Co-evaluation               AM-PAC PT "6 Clicks" Mobility  Outcome Measure Help needed turning from your back to your side while in a flat bed without using bedrails?: None Help needed moving from lying on your back to sitting on the side of a flat bed without using bedrails?: None Help needed moving to and from a bed to a chair (including a wheelchair)?: None Help needed standing up from a chair using your arms (e.g., wheelchair or bedside chair)?: None Help needed to walk in hospital room?: None Help needed climbing 3-5 steps with a railing? : A Little 6 Click Score: 23    End of Session Equipment  Utilized During Treatment: Gait belt Activity Tolerance: Patient tolerated treatment well Patient left: in bed;with call bell/phone within reach Nurse Communication: Mobility status PT Visit Diagnosis: Other abnormalities of gait and mobility (R26.89)    Time: 2409-7353 PT Time Calculation (min) (ACUTE ONLY): 23 min   Charges:   PT Evaluation $PT Eval Low Complexity: 1 Low PT Treatments $Gait Training: 8-22 mins       Leyton Brownlee E, PT Acute Rehabilitation Services Pager [phone]  Office [phone]  Tisa Weisel D Laverne Hursey 03/29/2019, 2:46 PM

## 2019-03-29 NOTE — TOC Transition Note (Signed)
Transition of Care [facility]) - CM/SW Discharge Note   Patient Details  Name: Spencer Castro MRN: [id number] Date of Birth: 1965-12-11  [facility] of [facility] CM/SW Contact:  Midge Minium RN, BSN, NCM-BC, ACM-RN [phone] Phone Number: 03/29/2019, 4:57 PM   Clinical Narrative:    CM spoke with the patient to discuss TOC needs. The patient is a 53 yo male s/p R Right Lumbar 3-4 Anterolateral decompression/interbody fusion with lateral plate. Patient lived at home PTA; utilized a walking stick to assist during ambulation. PT/OT eval completed with no HH recommended, but a RW. CM offered [place] with patient declining, but agreeable to a RW. DME preference list provided with no preference. DME referral given to [place] ([facility] liaison). Patient family will provide transportation home. No further needs from CM.    Final next level of care: Home/Self Care Barriers to Discharge: No Barriers Identified   Patient Goals and CMS Choice Patient states their goals for this hospitalization and ongoing recovery are:: "to get stronger" CMS Medicare.gov Compare Post Acute Care list provided to:: Patient Choice offered to / list presented to : Patient   Discharge Plan and Services                DME Arranged: Walker rolling DME Agency: [facility] Date DME Agency Contacted: 03/29/19 Time DME Agency Contacted: [phone] Representative spoke with at DME Agency: [place] (liaison) [place] Arranged: NA [place] Agency: NA        Social Determinants of Health ([place]) Interventions     Readmission Risk Interventions No flowsheet data found.

## 2019-03-29 NOTE — Evaluation (Signed)
Occupational Therapy Evaluation Patient Details Name: Spencer Castro MRN: [id number] DOB: August 02, 1965 Today's Date: 03/29/2019    History of Present Illness  Pt is a 53 yo male s/p R Right Lumbar 3-4 Anterolateral decompression/interbody fusion with lateral plate. PMHx: back sx.   Clinical Impression   Pt PTA: living with family. Pt currently with pain with mobility 5/10 and use of RW for stability with supervisionA. Pt using figure 4 technique for LB ADL. Pt modified independence for UB ADL and for light grooming at sink in standing. Pt does not require additional assist for mobility or transfers at this time. Pt does not require continued OT skilled services at this time. Spouse will be with pt to address needs. OT signing off.    Follow Up Recommendations  No OT follow up    Equipment Recommendations  Other (comment)(RW)    Recommendations for Other Services       Precautions / Restrictions Precautions Precautions: Back Precaution Booklet Issued: Yes (comment) Precaution Comments: verbal discussion with handout Required Braces or Orthoses: Spinal Brace Restrictions Weight Bearing Restrictions: No      Mobility Bed Mobility Overal bed mobility: Needs Assistance Bed Mobility: Sidelying to Sit   Sidelying to sit: Supervision       General bed mobility comments: use of rails  Transfers Overall transfer level: Needs assistance Equipment used: Rolling walker (2 wheeled) Transfers: Sit to/from Stand Sit to Stand: Supervision              Balance Overall balance assessment: No apparent balance deficits (not formally assessed)                                         ADL either performed or assessed with clinical judgement   ADL                                               Vision Baseline Vision/History: No visual deficits Vision Assessment?: No apparent visual deficits     Perception     Praxis      Pertinent  Vitals/Pain Pain Assessment: 0-10 Pain Score: 5  Pain Location: R hip and back Pain Descriptors / Indicators: Discomfort;Sore Pain Intervention(s): Monitored during session;RN gave pain meds during session     Hand Dominance Right   Extremity/Trunk Assessment Upper Extremity Assessment Upper Extremity Assessment: Overall WFL for tasks assessed   Lower Extremity Assessment Lower Extremity Assessment: Generalized weakness;RLE deficits/detail RLE Deficits / Details: pain from surgery   Cervical / Trunk Assessment Cervical / Trunk Assessment: Other exceptions Cervical / Trunk Exceptions: s/p lumbar sx   Communication Communication Communication: No difficulties   Cognition Arousal/Alertness: Awake/alert Behavior During Therapy: WFL for tasks assessed/performed Overall Cognitive Status: Within Functional Limits for tasks assessed                                     General Comments  Pt has zero gravity bed.    Exercises     Shoulder Instructions      Home Living Family/patient expects to be discharged to:: Private residence Living Arrangements: Spouse/significant other Available Help at Discharge: Family;Available PRN/intermittently Type of Home: House Home Access: Stairs to enter Entrance  Stairs-Number of Steps: 5 Entrance Stairs-Rails: Can reach both Home Layout: One level     Bathroom Shower/Tub: Occupational psychologist: Standard     Home Equipment: Other (comment)(walking stick)          Prior Functioning/Environment Level of Independence: Needs assistance  Gait / Transfers Assistance Needed: short distances for mobility ADL's / Homemaking Assistance Needed: Limited by pain; Pt took increased time to perform tasks; pt cooking small meals            OT Problem List: Decreased strength;Decreased activity tolerance      OT Treatment/Interventions:      OT Goals(Current goals can be found in the care plan section) Acute Rehab  OT Goals Patient Stated Goal: to go home OT Goal Formulation: With patient  OT Frequency:     Barriers to D/C:            Co-evaluation              AM-PAC OT "6 Clicks" Daily Activity     Outcome Measure Help from another person eating meals?: None Help from another person taking care of personal grooming?: None Help from another person toileting, which includes using toliet, bedpan, or urinal?: None Help from another person bathing (including washing, rinsing, drying)?: A Little Help from another person to put on and taking off regular upper body clothing?: None Help from another person to put on and taking off regular lower body clothing?: A Little 6 Click Score: 22   End of Session Equipment Utilized During Treatment: Rolling walker;Back brace Nurse Communication: Mobility status  Activity Tolerance: Patient limited by pain;Patient tolerated treatment well Patient left: in chair;with call bell/phone within reach  OT Visit Diagnosis: Unsteadiness on feet (R26.81);Muscle weakness (generalized) (M62.81)                Time: 9509-3267 OT Time Calculation (min): 37 min Charges:  OT General Charges $OT Visit: 1 Visit OT Evaluation $OT Eval Moderate Complexity: 1 Mod OT Treatments $Self Care/Home Management : 8-22 mins  Ebony Hail Harold Hedge) Marsa Aris OTR/L Acute Rehabilitation Services Pager: [phone] Office: [phone]   BHALPFX T KWIOX 03/29/2019, 11:04 AM

## 2019-03-29 NOTE — Progress Notes (Signed)
Received call from [employer]. CM- Cindy with [facility]- contact # [phone] (fax- [phone])- no HH needs noted- pt does need RW- per Jenny Reichmann she is ok with pt getting RW from here with [facility].

## 2019-04-02 MED FILL — Heparin Sodium (Porcine) Inj 1000 Unit/ML: INTRAMUSCULAR | Qty: 30 | Status: AC

## 2019-04-02 MED FILL — Sodium Chloride IV Soln 0.9%: INTRAVENOUS | Qty: 1000 | Status: AC

## 2019-05-03 ENCOUNTER — Other Ambulatory Visit: Payer: Self-pay

## 2019-05-03 DIAGNOSIS — Z20822 Contact with and (suspected) exposure to covid-19: Secondary | ICD-10-CM

## 2019-05-04 LAB — NOVEL CORONAVIRUS, NAA: SARS-CoV-2, NAA: NOT DETECTED

## 2019-12-12 ENCOUNTER — Ambulatory Visit: Payer: Self-pay | Admitting: Physician Assistant

## 2021-03-05 ENCOUNTER — Other Ambulatory Visit: Payer: Self-pay | Admitting: Neurosurgery

## 2021-03-05 DIAGNOSIS — M5416 Radiculopathy, lumbar region: Secondary | ICD-10-CM

## 2021-03-15 ENCOUNTER — Ambulatory Visit
Admission: RE | Admit: 2021-03-15 | Discharge: 2021-03-15 | Disposition: A | Discharge status: Home / Self Care | Payer: No Typology Code available for payment source | Source: Ambulatory Visit | Attending: Neurosurgery | Admitting: Neurosurgery

## 2021-03-15 DIAGNOSIS — M5416 Radiculopathy, lumbar region: Secondary | ICD-10-CM

## 2021-03-15 MED ORDER — ONDANSETRON HCL 4 MG/2ML IJ SOLN
4.0000 mg | Freq: Once | INTRAMUSCULAR | Status: AC | PRN
  Administered 2021-03-15: 4 mg via INTRAMUSCULAR

## 2021-03-15 MED ORDER — DIAZEPAM 5 MG PO TABS
10.0000 mg | ORAL_TABLET | Freq: Once | ORAL | Status: AC
  Administered 2021-03-15: 10 mg via ORAL

## 2021-03-15 MED ORDER — MEPERIDINE HCL 50 MG/ML IJ SOLN
50.0000 mg | Freq: Once | INTRAMUSCULAR | Status: AC | PRN
  Administered 2021-03-15: 50 mg via INTRAMUSCULAR

## 2021-03-15 MED ORDER — IOPAMIDOL (ISOVUE-M 200) INJECTION 41%
15.0000 mL | Freq: Once | INTRAMUSCULAR | Status: AC
  Administered 2021-03-15: 15 mL via INTRATHECAL

## 2021-03-15 NOTE — Discharge Instr - Other Orders (Addendum)
1118: pt reports pain 6/10 from myelogram procedure. See MAR.  1145: Relief reported.

## 2021-03-15 NOTE — Discharge Instructions (Signed)

## 2021-03-15 NOTE — Progress Notes (Signed)
Pt reports his spinal cord stimulator has been turned off for the CT myelogram procedure.

## 2022-09-30 ENCOUNTER — Encounter: Payer: Self-pay | Admitting: Internal Medicine

## 2022-10-18 ENCOUNTER — Ambulatory Visit (AMBULATORY_SURGERY_CENTER): Payer: Medicare Other

## 2022-10-18 ENCOUNTER — Encounter: Payer: Self-pay | Admitting: Internal Medicine

## 2022-10-18 VITALS — Ht 72.0 in | Wt 206.0 lb

## 2022-10-18 DIAGNOSIS — Z1211 Encounter for screening for malignant neoplasm of colon: Secondary | ICD-10-CM

## 2022-10-18 MED ORDER — SUTAB 1479-225-188 MG PO TABS: 12.0000 | ORAL_TABLET | ORAL | 0 refills | Status: DC

## 2022-10-18 NOTE — Progress Notes (Signed)
No egg or soy allergy known to patient  No issues known to pt with past sedation with any surgeries or procedures Patient denies ever being told they had issues or difficulty with intubation  No FH of Malignant Hyperthermia Pt is not on diet pills Pt is not on  home 02  Pt is not on blood thinners  Pt denies issues with constipation  No A fib or A flutter Have any cardiac testing pending--no  Pt is independent   Patient's chart reviewed by Cathlyn Parsons CNRA prior to previsit and patient appropriate for the [facility].  Previsit completed and red dot placed by patient's name on their procedure day (on provider's schedule).     Pt requesting sutab over Golytely his wife had the pills and he is wanting the same. Pt understand his insurance may not cover this and he will be responsible for any out of pocket cost. Pt is agreeable.

## 2022-10-30 ENCOUNTER — Encounter: Payer: Self-pay | Admitting: Certified Registered Nurse Anesthetist

## 2022-11-02 ENCOUNTER — Ambulatory Visit (AMBULATORY_SURGERY_CENTER): Payer: Medicare Other | Admitting: Internal Medicine

## 2022-11-02 ENCOUNTER — Encounter: Payer: Self-pay | Admitting: Internal Medicine

## 2022-11-02 VITALS — BP 109/77 | HR 74 | Temp 97.8°F | Resp 11 | Ht 72.0 in | Wt 206.0 lb

## 2022-11-02 DIAGNOSIS — D120 Benign neoplasm of cecum: Secondary | ICD-10-CM | POA: Diagnosis not present

## 2022-11-02 DIAGNOSIS — Z1211 Encounter for screening for malignant neoplasm of colon: Secondary | ICD-10-CM | POA: Diagnosis not present

## 2022-11-02 DIAGNOSIS — K6389 Other specified diseases of intestine: Secondary | ICD-10-CM

## 2022-11-02 MED ORDER — SODIUM CHLORIDE 0.9 % IV SOLN
500.0000 mL | INTRAVENOUS | Status: DC
Start: 2022-11-02 — End: 2022-11-02

## 2022-11-02 NOTE — Patient Instructions (Addendum)
The prep did not clean you out well enough. I tried to complete the exam but could not adequately clear things to see well enough. I did find one tiny polyp and removed it.  You need to be rescheduled and have a double (2 day) preparation.  I appreciate the opportunity to care for you. Iva Boop, MD, [facility]  Repeat colonoscopy with 2 day prep scheduled for July 5th, 2024 @ 9 am (please be here by 8 am)   YOU HAD AN ENDOSCOPIC PROCEDURE TODAY AT THE  ENDOSCOPY CENTER:   Refer to the procedure report that was given to you for any specific questions about what was found during the examination.  If the procedure report does not answer your questions, please call your gastroenterologist to clarify.  If you requested that your care partner not be given the details of your procedure findings, then the procedure report has been included in a sealed envelope for you to review at your convenience later.  YOU SHOULD EXPECT: Some feelings of bloating in the abdomen. Passage of more gas than usual.  Walking can help get rid of the air that was put into your GI tract during the procedure and reduce the bloating. If you had a lower endoscopy (such as a colonoscopy or flexible sigmoidoscopy) you may notice spotting of blood in your stool or on the toilet paper. If you underwent a bowel prep for your procedure, you may not have a normal bowel movement for a few days.  Please Note:  You might notice some irritation and congestion in your nose or some drainage.  This is from the oxygen used during your procedure.  There is no need for concern and it should clear up in a day or so.  SYMPTOMS TO REPORT IMMEDIATELY:  Following lower endoscopy (colonoscopy or flexible sigmoidoscopy):  Excessive amounts of blood in the stool  Significant tenderness or worsening of abdominal pains  Swelling of the abdomen that is new, acute  Fever of 100F or higher  For urgent or emergent issues, a gastroenterologist can  be reached at any hour by calling [phone]. Do not use MyChart messaging for urgent concerns.    DIET:  We do recommend a small meal at first, but then you may proceed to your regular diet.  Drink plenty of fluids but you should avoid alcoholic beverages for 24 hours.  ACTIVITY:  You should plan to take it easy for the rest of today and you should NOT DRIVE or use heavy machinery until tomorrow (because of the sedation medicines used during the test).    FOLLOW UP: Our staff will call the number listed on your records the next business day following your procedure.  We will call around 7:15- 8:00 am to check on you and address any questions or concerns that you may have regarding the information given to you following your procedure. If we do not reach you, we will leave a message.     If any biopsies were taken you will be contacted by phone or by letter within the next 1-3 weeks.  Please call us at [phone] if you have not heard about the biopsies in 3 weeks.    SIGNATURES/CONFIDENTIALITY: You and/or your care partner have signed paperwork which will be entered into your electronic medical record.  These signatures attest to the fact that that the information above on your After Visit Summary has been reviewed and is understood.  Full responsibility of the confidentiality of  this discharge information lies with you and/or your care-partner.

## 2022-11-02 NOTE — Progress Notes (Signed)
Called to room to assist during endoscopic procedure.  Patient ID and intended procedure confirmed with present staff. Received instructions for my participation in the procedure from the performing physician.  

## 2022-11-02 NOTE — Op Note (Signed)
[facility] Patient Name: Emmaus Kovacic Procedure Date: 11/02/2022 10:34 AM MRN: [id number] Endoscopist: Iva Boop , MD, [id number] Age: 57 Referring MD:  Date of Birth: 09-05-1965 Gender: Male Account #: [id number] Procedure:                Colonoscopy Indications:              Screening for colorectal malignant neoplasm, This                            is the patient's first colonoscopy Medicines:                Monitored Anesthesia Care Procedure:                Pre-Anesthesia Assessment:                           - Prior to the procedure, a History and Physical                            was performed, and patient medications and                            allergies were reviewed. The patient's tolerance of                            previous anesthesia was also reviewed. The risks                            and benefits of the procedure and the sedation                            options and risks were discussed with the patient.                            All questions were answered, and informed consent                            was obtained. Prior Anticoagulants: The patient has                            taken no anticoagulant or antiplatelet agents. ASA                            Grade Assessment: II - A patient with mild systemic                            disease. After reviewing the risks and benefits,                            the patient was deemed in satisfactory condition to                            undergo the procedure.  After obtaining informed consent, the colonoscope                            was passed under direct vision. Throughout the                            procedure, the patient's blood pressure, pulse, and                            oxygen saturations were monitored continuously. The                            Olympus CF-HQ190L [id number]) Colonoscope was                            introduced through the anus  and advanced to the the                            cecum, identified by appendiceal orifice and                            ileocecal valve. The colonoscopy was performed with                            difficulty due to inadequate bowel prep. The                            patient tolerated the procedure well. The quality                            of the bowel preparation was poor. The ileocecal                            valve, appendiceal orifice, and rectum were                            photographed. The bowel preparation used was SUTAB                            via split dose instruction. Scope In: 10:46:00 AM Scope Out: 11:01:21 AM Scope Withdrawal Time: 0 hours 8 minutes 5 seconds  Total Procedure Duration: 0 hours 15 minutes 21 seconds  Findings:                 The perianal and digital rectal examinations were                            normal.                           A 1 mm polyp was found in the cecum. The polyp was                            sessile. The polyp was removed with a cold biopsy  forceps. Resection and retrieval were complete.                            Verification of patient identification for the                            specimen was done. Estimated blood loss was minimal.                           The colon (entire examined portion) was redundant.                           The exam was otherwise without abnormality on                            direct and retroflexion views. Complications:            No immediate complications. Estimated Blood Loss:     Estimated blood loss was minimal. Impression:               - Preparation of the colon was poor.                           - One 1 mm polyp in the cecum, removed with a cold                            biopsy forceps. Resected and retrieved.                           - Redundant colon.                           - The examination was otherwise normal on direct                             and retroflexion views. Recommendation:           - Patient has a contact number available for                            emergencies. The signs and symptoms of potential                            delayed complications were discussed with the                            patient. Return to normal activities tomorrow.                            Written discharge instructions were provided to the                            patient.                           - Resume previous diet.                           -  Continue present medications.                           - Await pathology results.                           - schedule repeat colonoscopy with double prep Iva Boop, MD 11/02/2022 11:09:08 AM This report has been signed electronically.

## 2022-11-02 NOTE — Progress Notes (Signed)
Report given to PACU, vss 

## 2022-11-02 NOTE — Progress Notes (Signed)
[facility] History and Physical   Primary Care Physician:  Rodrigo Ran, MD   Reason for Procedure:   CRCA screening  Plan:    colonoscopy     HPI: Spencer Castro is a 57 y.o. male herefor screening examination   Past Medical History:  Diagnosis Date   Anxiety    takes valium   Arthritis    Carpal tunnel syndrome    Chronic back pain    Hypertension    Prostate enlargement     Past Surgical History:  Procedure Laterality Date   ANTERIOR LAT LUMBAR FUSION Right 03/28/2019   Procedure: Right Lumbar three-four Anterolateral decompression/interbody fusion with lateral plate;  Surgeon: Maeola Harman, MD;  Location: [facility] OR;  Service: Neurosurgery;  Laterality: Right;   BACK SURGERY  2002   Lumbar fusion l4-5   CARPAL TUNNEL RELEASE  2012   left elbow and wrist   I & D EXTREMITY  11/08/2011   Procedure: IRRIGATION AND DEBRIDEMENT EXTREMITY;  Surgeon: Wyn Forster., MD;  Location: [facility];  Service: Orthopedics;  Laterality: Left;  I&D left hand   LUMBAR FUSION  2002   SPINAL CORD STIMULATOR INSERTION  2008   SPINAL CORD STIMULATOR INSERTION  04/01/2011   Procedure: LUMBAR SPINAL CORD STIMULATOR INSERTION;  Surgeon: Dorian Heckle, MD;  Location: [facility];  Service: Neurosurgery;  Laterality: N/A;  REVISION OF SPINAL CORD STIMULATOR WITH INTERNAL PULSE GENERATOR    Prior to Admission medications   Medication Sig Start Date End Date Taking? Authorizing Provider  ADDERALL 20 MG tablet Take 10 mg by mouth 2 (two) times daily. 10/11/22  Yes [provider]  amitriptyline (ELAVIL) 25 MG tablet Take 25-50 mg by mouth at bedtime.    Yes [provider]  ascorbic acid (VITAMIN C) 500 MG tablet Take 500 mg by mouth daily.   Yes [provider]  Cholecalciferol (VITAMIN D) 125 MCG (5000 UT) CAPS Take 5,000 Units by mouth daily.   Yes [provider]  Cyanocobalamin (B-12) 2500 MCG TABS Take 2,500 mcg by mouth  daily.   Yes [provider]  cyclobenzaprine (FLEXERIL) 10 MG tablet Take 10 mg by mouth 2 (two) times daily.   Yes [provider]  diazepam (VALIUM) 10 MG tablet Take 10 mg by mouth 3 (three) times daily.   Yes [provider]  HYDROmorphone (DILAUDID) 8 MG tablet Take 8 mg by mouth every 6 (six) hours.   Yes [provider]  levothyroxine (SYNTHROID) 50 MCG tablet Take 50 mcg by mouth daily before breakfast. 06/15/22  Yes [provider]  losartan (COZAAR) 50 MG tablet Take 50 mg by mouth daily. 09/22/22  Yes [provider]  rosuvastatin (CRESTOR) 10 MG tablet Take 10 mg by mouth at bedtime. 08/13/22  Yes [provider]  XTAMPZA ER 18 MG C12A Take 1 capsule by mouth every 12 (twelve) hours.   Yes [provider]  lidocaine (LIDODERM) 5 % Place 1-2 patches onto the skin daily as needed (pain). Remove & Discard patch within 12 hours or as directed by MD Patient not taking: Reported on 11/02/2022    [provider]  testosterone cypionate (DEPOTESTOSTERONE CYPIONATE) 200 MG/ML injection Inject 200 mg into the skin. Every 10 days 07/23/22   [provider]    Current Outpatient Medications  Medication Sig Dispense Refill   ADDERALL 20 MG tablet Take 10 mg by mouth 2 (two) times daily.  amitriptyline (ELAVIL) 25 MG tablet Take 25-50 mg by mouth at bedtime.      ascorbic acid (VITAMIN C) 500 MG tablet Take 500 mg by mouth daily.     Cholecalciferol (VITAMIN D) 125 MCG (5000 UT) CAPS Take 5,000 Units by mouth daily.     Cyanocobalamin (B-12) 2500 MCG TABS Take 2,500 mcg by mouth daily.     cyclobenzaprine (FLEXERIL) 10 MG tablet Take 10 mg by mouth 2 (two) times daily.     diazepam (VALIUM) 10 MG tablet Take 10 mg by mouth 3 (three) times daily.     HYDROmorphone (DILAUDID) 8 MG tablet Take 8 mg by mouth every 6 (six) hours.     levothyroxine (SYNTHROID) 50 MCG tablet Take 50 mcg by mouth daily before  breakfast.     losartan (COZAAR) 50 MG tablet Take 50 mg by mouth daily.     rosuvastatin (CRESTOR) 10 MG tablet Take 10 mg by mouth at bedtime.     XTAMPZA ER 18 MG C12A Take 1 capsule by mouth every 12 (twelve) hours.     lidocaine (LIDODERM) 5 % Place 1-2 patches onto the skin daily as needed (pain). Remove & Discard patch within 12 hours or as directed by MD (Patient not taking: Reported on 11/02/2022)     testosterone cypionate (DEPOTESTOSTERONE CYPIONATE) 200 MG/ML injection Inject 200 mg into the skin. Every 10 days     Current Facility-Administered Medications  Medication Dose Route Frequency Provider Last Rate Last Admin   0.9 %  sodium chloride infusion  500 mL Intravenous Continuous Iva Boop, MD        Allergies as of 11/02/2022 - Review Complete 11/02/2022  Allergen Reaction Noted   Nabumetone Other (See Comments) 03/24/2011   Morphine and codeine Itching 11/05/2013   Penicillins Rash     Family History  Problem Relation Age of Onset   Colon cancer Neg Hx    Rectal cancer Neg Hx    Stomach cancer Neg Hx    Esophageal cancer Neg Hx     Social History   Socioeconomic History   Marital status: Married    Spouse name: Not on file   Number of children: Not on file   Years of education: Not on file   Highest education level: Not on file  Occupational History   Not on file  Tobacco Use   Smoking status: Former   Smokeless tobacco: Never  Vaping Use   Vaping Use: Some days  Substance and Sexual Activity   Alcohol use: No   Drug use: No   Sexual activity: Yes  Other Topics Concern   Not on file  Social History Narrative   Not on file   Social Determinants of Health   Financial Resource Strain: Not on file  Food Insecurity: Not on file  Transportation Needs: Not on file  Physical Activity: Not on file  Stress: Not on file  Social Connections: Not on file  Intimate Partner Violence: Not on file    Review of Systems:  All other review of systems  negative except as mentioned in the HPI.  Physical Exam: Vital signs BP 119/78   Pulse 69   Temp 97.8 F (36.6 C)   Ht 6' (1.829 m)   Wt 206 lb (93.4 kg)   SpO2 96%   BMI 27.94 kg/m   General:   Alert,  Well-developed, well-nourished, pleasant and cooperative in NAD Lungs:  Clear throughout to auscultation.   Heart:  Regular  rate and rhythm; no murmurs, clicks, rubs,  or gallops. Abdomen:  Soft, nontender and nondistended. Normal bowel sounds.   Neuro/Psych:  Alert and cooperative. Normal mood and affect. A and O x 3   @Zully Frane  Sena Slate, MD, [facility] [phone] (pager) 11/02/2022 10:39 AM@

## 2022-11-02 NOTE — Progress Notes (Signed)
Patient reports no health or medicine changes since pre visit. 

## 2022-11-03 ENCOUNTER — Telehealth: Payer: Self-pay

## 2022-11-03 NOTE — Telephone Encounter (Signed)
Follow up call to pt, lm for pt to call if having any difficulty with normal activities or eating and drinking.  Also to call if any other questions or concerns.  

## 2022-11-15 ENCOUNTER — Telehealth: Payer: Self-pay | Admitting: Internal Medicine

## 2022-11-15 MED ORDER — NA SULFATE-K SULFATE-MG SULF 17.5-3.13-1.6 GM/177ML PO SOLN
1.0000 | Freq: Once | ORAL | 0 refills | Status: AC
Start: 2022-11-15 — End: 2022-11-15

## 2022-11-15 NOTE — Telephone Encounter (Signed)
Inbound call from patient needing prep sent into the pharmacy. Please advise. 

## 2022-11-15 NOTE — Telephone Encounter (Signed)
Call to pt wife and reviewed instructions and sent suprep to [facility]

## 2022-11-15 NOTE — Addendum Note (Signed)
Addended by: Mason Jim on: 11/15/2022 01:00 PM   Modules accepted: Orders

## 2022-11-18 ENCOUNTER — Ambulatory Visit (AMBULATORY_SURGERY_CENTER): Payer: Medicare Other | Admitting: Internal Medicine

## 2022-11-18 ENCOUNTER — Encounter: Payer: Self-pay | Admitting: Internal Medicine

## 2022-11-18 VITALS — BP 117/83 | HR 65 | Temp 96.2°F | Resp 12 | Ht 72.0 in | Wt 206.0 lb

## 2022-11-18 DIAGNOSIS — Z1211 Encounter for screening for malignant neoplasm of colon: Secondary | ICD-10-CM

## 2022-11-18 MED ORDER — SODIUM CHLORIDE 0.9 % IV SOLN
500.00 mL | Freq: Once | INTRAVENOUS | Status: DC
Start: 2022-11-18 — End: 2022-11-18

## 2022-11-18 NOTE — Op Note (Signed)
[facility] Patient Name: Spencer Castro Procedure Date: 11/18/2022 9:07 AM MRN: [id number] Endoscopist: Iva Boop , MD, [id number] Age: 57 Referring MD:  Date of Birth: Sep 16, 1965 Gender: Male Account #: [id number] Procedure:                Colonoscopy Indications:              Screening for colorectal malignant neoplasm Medicines:                Monitored Anesthesia Care Procedure:                Pre-Anesthesia Assessment:                           - Prior to the procedure, a History and Physical                            was performed, and patient medications and                            allergies were reviewed. The patient's tolerance of                            previous anesthesia was also reviewed. The risks                            and benefits of the procedure and the sedation                            options and risks were discussed with the patient.                            All questions were answered, and informed consent                            was obtained. Prior Anticoagulants: The patient has                            taken no anticoagulant or antiplatelet agents. ASA                            Grade Assessment: II - A patient with mild systemic                            disease. After reviewing the risks and benefits,                            the patient was deemed in satisfactory condition to                            undergo the procedure.                           After obtaining informed consent, the colonoscope  was passed under direct vision. Throughout the                            procedure, the patient's blood pressure, pulse, and                            oxygen saturations were monitored continuously. The                            Olympus CF-HQ190L [id number]) Colonoscope was                            introduced through the anus and advanced to the the                            cecum, identified by  appendiceal orifice and                            ileocecal valve. The colonoscopy was somewhat                            difficult due to a redundant colon. Successful                            completion of the procedure was aided by applying                            abdominal pressure. The patient tolerated the                            procedure well. The quality of the bowel                            preparation was fair. The ileocecal valve,                            appendiceal orifice, and rectum were photographed.                            The bowel preparation used was Miralax via extended                            prep with split dose instruction. The bowel                            preparation used was SUPREP via extended prep with                            split dose instruction. Scope In: 9:12:45 AM Scope Out: 9:37:47 AM Scope Withdrawal Time: 0 hours 17 minutes 2 seconds  Total Procedure Duration: 0 hours 25 minutes 2 seconds  Findings:                 The perianal and digital rectal examinations were  normal. Pertinent negatives include normal prostate                            (size, shape, and consistency).                           The colon (entire examined portion) was redundant.                            Advancing the scope required using manual pressure.                           The exam was otherwise without abnormality on                            direct and retroflexion views. Complications:            No immediate complications. Estimated Blood Loss:     Estimated blood loss: none. Impression:               - Preparation of the colon was fair. Retained                            vegetable material and mucoid stool - irrigated and                            suctioned.                           - Redundant colon.                           - The examination was otherwise normal on direct                            and  retroflexion views.                           - No specimens collected. Recommendation:           - Patient has a contact number available for                            emergencies. The signs and symptoms of potential                            delayed complications were discussed with the                            patient. Return to normal activities tomorrow.                            Written discharge instructions were provided to the                            patient.                           -  Resume previous diet.                           - Continue present medications.                           - OFFICIE VISIT RECALL 1 YEAR AND WILL DISCUSS                            ALTERNATIVE PREP MEASURES - - NEED TO CONSIDER                            LONGER LOW RESIDUE DIET = USE OF LINZESS DAYS PRIOR                            TO COLONOSCOPY                           THIS PREP WAS FAIR VS POOR 10/24/22 Iva Boop, MD 11/18/2022 9:46:22 AM This report has been signed electronically.

## 2022-11-18 NOTE — Progress Notes (Signed)
A and O x3. Report to RN. Tolerated MAC anesthesia well. 

## 2022-11-18 NOTE — Progress Notes (Signed)
History and Physical Interval Note:  11/18/2022 9:06 AM  Spencer Castro  has presented today for endoscopic procedure(s), with the diagnosis of  Encounter Diagnosis  Name Primary?   Special screening for malignant neoplasms, colon Yes  .  The various methods of evaluation and treatment have been discussed with the patient and/or family. After consideration of risks, benefits and other options for treatment, the patient has consented to  the endoscopic procedure(s).  Had incomplete exam 6/19 - poor prep so has returned.   The patient's history has been reviewed, patient examined, no change in status, stable for endoscopic procedure(s).  I have reviewed the patient's chart and labs.  Questions were answered to the patient's satisfaction.     Iva Boop, MD, [facility]

## 2022-11-18 NOTE — Patient Instructions (Addendum)
No polyps seen.  Preparation was better but still not as good as desired.  Will plan on an office visit in 1 year to discuss repeating colonoscopy or other tests and timing of such + preparation ideas.  I appreciate the opportunity to care for you. Iva Boop, MD, [facility]  Resume previous diet Continue present medications  YOU HAD AN ENDOSCOPIC PROCEDURE TODAY AT THE [facility]:   Refer to the procedure report that was given to you for any specific questions about what was found during the examination.  If the procedure report does not answer your questions, please call your gastroenterologist to clarify.  If you requested that your care partner not be given the details of your procedure findings, then the procedure report has been included in a sealed envelope for you to review at your convenience later.  YOU SHOULD EXPECT: Some feelings of bloating in the abdomen. Passage of more gas than usual.  Walking can help get rid of the air that was put into your GI tract during the procedure and reduce the bloating. If you had a lower endoscopy (such as a colonoscopy or flexible sigmoidoscopy) you may notice spotting of blood in your stool or on the toilet paper. If you underwent a bowel prep for your procedure, you may not have a normal bowel movement for a few days.  Please Note:  You might notice some irritation and congestion in your nose or some drainage.  This is from the oxygen used during your procedure.  There is no need for concern and it should clear up in a day or so.  SYMPTOMS TO REPORT IMMEDIATELY:  Following lower endoscopy (colonoscopy):  Excessive amounts of blood in the stool  Significant tenderness or worsening of abdominal pains  Swelling of the abdomen that is new, acute  Fever of 100F or higher  For urgent or emergent issues, a gastroenterologist can be reached at any hour by calling [phone]. Do not use MyChart messaging for urgent concerns.    DIET:  We do recommend a small meal at first, but then you may proceed to your regular diet.  Drink plenty of fluids but you should avoid alcoholic beverages for 24 hours.  ACTIVITY:  You should plan to take it easy for the rest of today and you should NOT DRIVE or use heavy machinery until tomorrow (because of the sedation medicines used during the test).    FOLLOW UP: Our staff will call the number listed on your records the next business day following your procedure.  We will call around 7:15- 8:00 am to check on you and address any questions or concerns that you may have regarding the information given to you following your procedure. If we do not reach you, we will leave a message.     SIGNATURES/CONFIDENTIALITY: You and/or your care partner have signed paperwork which will be entered into your electronic medical record.  These signatures attest to the fact that that the information above on your After Visit Summary has been reviewed and is understood.  Full responsibility of the confidentiality of this discharge information lies with you and/or your care-partner.

## 2022-11-21 ENCOUNTER — Telehealth: Payer: Self-pay

## 2022-11-21 NOTE — Telephone Encounter (Signed)
  Follow up Call-     11/18/2022    8:21 AM 11/02/2022   10:18 AM 03/15/2021   10:12 AM  Call back number  Post procedure Call Back phone  # [phone]  Permission to leave phone message Yes Yes      Patient questions:  Do you have a fever, pain , or abdominal swelling? No. Pain Score  0 *  Have you tolerated food without any problems? Yes.    Have you been able to return to your normal activities? Yes.    Do you have any questions about your discharge instructions: Diet   No. Medications  No. Follow up visit  No.  Do you have questions or concerns about your Care? No.  Actions: * If pain score is 4 or above: No action needed, pain <4.  Patient unavailable, spoke with patient's wife, Danella Deis.

## 2023-11-09 ENCOUNTER — Encounter: Payer: Self-pay | Admitting: Internal Medicine

## 2024-05-02 ENCOUNTER — Other Ambulatory Visit: Payer: Self-pay | Admitting: Internal Medicine

## 2024-05-02 DIAGNOSIS — E785 Hyperlipidemia, unspecified: Secondary | ICD-10-CM

## 2024-05-14 ENCOUNTER — Other Ambulatory Visit
